# Patient Record
Sex: Male | Born: 1980 | Race: White | Hispanic: No | Marital: Single | Smoking: Former smoker
Health system: Southern US, Community
[De-identification: ages and names within clinical notes are randomized; demographics above are authoritative.]

## PROBLEM LIST (undated history)

## (undated) DIAGNOSIS — R002 Palpitations: Secondary | ICD-10-CM

## (undated) DIAGNOSIS — R000 Tachycardia, unspecified: Secondary | ICD-10-CM

---

## 2016-05-24 ENCOUNTER — Emergency Department (HOSPITAL_BASED_OUTPATIENT_CLINIC_OR_DEPARTMENT_OTHER): Payer: BLUE CROSS/BLUE SHIELD

## 2016-05-24 ENCOUNTER — Encounter (HOSPITAL_BASED_OUTPATIENT_CLINIC_OR_DEPARTMENT_OTHER): Payer: Self-pay | Admitting: Emergency Medicine

## 2016-05-24 ENCOUNTER — Emergency Department (HOSPITAL_BASED_OUTPATIENT_CLINIC_OR_DEPARTMENT_OTHER)
Admission: EM | Admit: 2016-05-24 | Discharge: 2016-05-24 | Disposition: A | Discharge status: Home / Self Care | Payer: BLUE CROSS/BLUE SHIELD | Attending: Emergency Medicine | Admitting: Emergency Medicine

## 2016-05-24 DIAGNOSIS — R079 Chest pain, unspecified: Secondary | ICD-10-CM | POA: Diagnosis present

## 2016-05-24 DIAGNOSIS — Z87891 Personal history of nicotine dependence: Secondary | ICD-10-CM | POA: Insufficient documentation

## 2016-05-24 DIAGNOSIS — R0789 Other chest pain: Secondary | ICD-10-CM | POA: Diagnosis not present

## 2016-05-24 HISTORY — DX: Palpitations: R00.2

## 2016-05-24 HISTORY — DX: Tachycardia, unspecified: R00.0

## 2016-05-24 MED ORDER — IBUPROFEN 800 MG PO TABS
800.0000 mg | ORAL_TABLET | Freq: Once | ORAL | Status: AC
  Administered 2016-05-24: 800 mg via ORAL
  Filled 2016-05-24: qty 1

## 2016-05-24 NOTE — Discharge Instructions (Addendum)
Fill the prescription for the anti-inflammatory given to you at urgent care. Take the medicine as prescribed. Routine primary care or cardiologist follow-up.

## 2016-05-24 NOTE — ED Notes (Signed)
Pt on cardiac monitor, continuous pulse ox and automatic VS. 

## 2016-05-24 NOTE — ED Triage Notes (Signed)
Patient states that he is having right sided chest pain for about 4 -5 days. The patient reports that he has already been "tested" for this and nothing was found. He is supposed to follow up with his cardiologist. He reports blurred vision, and palpitations with this

## 2016-05-24 NOTE — ED Provider Notes (Addendum)
[facility] Provider [facility]   CSN: [id number] Arrival date & time: 05/24/16  1246     History   Chief Complaint Chief Complaint  Patient presents with  . Chest Pain    HPI Scott Anthony is a 35 y.o. male.  HPI:  Patient presents for evaluation of right-sided chest pain. Apparently he was at an urgent care center earlier today. He states a reassured him that "everything looked okay". He was given a prescription for anti-inflammatory that "starts with a D". He states that he stopped and filled the medication at pharmacy. However, he did not take any other than he proceeded here for evaluation.  He describes point right-sided chest pain intermittent for last 4 days. States occasionally it is sharp and radiates around to his back. It is not left-sided. It does not get lightheaded dizzy weak or short of breath. Triage note said he states he gets blurred vision. He states that this is "almost all the time I guess I need glasses". He sees a cardiologist for "palpitations". States he's had a stress test and Holter monitor "they told me that my heart rate just gets high and they told me I could take a low strength beta blocker.  Is not smoke. He has no history of hypertension diabetes. States his dad had high cholesterol but he "thinks" that his is okay. No cocaine or amphetamine use. No risks for DVT. Family history negative. Heart score O, with pending EKG and chest x-ray.  Past Medical History:  Diagnosis Date  . Palpitations   . Tachycardia     There are no active problems to display for this patient.   History reviewed. No pertinent surgical history.     Home Medications    Prior to Admission medications   Not on File    Family History History reviewed. No pertinent family history.  Social History Social History  Substance Use Topics  . Smoking status: Former Games developermoker  . Smokeless tobacco: Never Used  . Alcohol use Yes     Comment: socially      Allergies   Ivp dye [iodinated diagnostic agents] and Keflex [cephalexin]   Review of Systems Review of Systems  Constitutional: Negative for appetite change, chills, diaphoresis, fatigue and fever.  HENT: Negative for mouth sores, sore throat and trouble swallowing.   Eyes: Negative for visual disturbance.  Respiratory: Negative for cough, chest tightness, shortness of breath and wheezing.   Cardiovascular: Positive for chest pain.  Gastrointestinal: Negative for abdominal distention, abdominal pain, diarrhea, nausea and vomiting.  Endocrine: Negative for polydipsia, polyphagia and polyuria.  Genitourinary: Negative for dysuria, frequency and hematuria.  Musculoskeletal: Negative for gait problem.  Skin: Negative for color change, pallor and rash.  Neurological: Negative for dizziness, syncope, light-headedness and headaches.  Hematological: Does not bruise/bleed easily.  Psychiatric/Behavioral: Negative for behavioral problems and confusion.     Physical Exam Updated Vital Signs BP 126/88 (BP Location: Right Arm)   Pulse 74   Temp 97.9 F (36.6 C) (Oral)   Resp 16   Ht 5\' 10"  (1.778 m)   Wt 169 lb 6.4 oz (76.8 kg)   SpO2 99%   BMI 24.31 kg/m   Physical Exam  Constitutional: He is oriented to person, place, and time. He appears well-developed and well-nourished. No distress.  HENT:  Head: Normocephalic.  Eyes: Conjunctivae are normal. Pupils are equal, round, and reactive to light. No scleral icterus.  Neck: Normal range of motion. Neck supple. No thyromegaly present.  Cardiovascular: Normal rate and regular rhythm.  Exam reveals no gallop and no friction rub.   No murmur heard. Pulmonary/Chest: Effort normal and breath sounds normal. No respiratory distress. He has no wheezes. He has no rales.    Abdominal: Soft. Bowel sounds are normal. He exhibits no distension. There is no tenderness. There is no rebound.  Musculoskeletal: Normal range of motion.   Neurological: He is alert and oriented to person, place, and time.  Skin: Skin is warm and dry. No rash noted.  Psychiatric: He has a normal mood and affect. His behavior is normal.     ED Treatments / Results  Labs (all labs ordered are listed, but only abnormal results are displayed) Labs Reviewed - No data to display  EKG  EKG Interpretation None       Radiology No results found.  Procedures Procedures (including critical care time)  Medications Ordered in ED Medications  ibuprofen (ADVIL,MOTRIN) tablet 800 mg (not administered)     Initial Impression / Assessment and Plan / ED Course  I have reviewed the triage vital signs and the nursing notes.  Pertinent labs & imaging results that were available during my care of the patient were reviewed by me and considered in my medical decision making (see chart for details).  Clinical Course     Low-risk patient. Not tachycardic or hypoxemic. No risks for DVT PE. Park negative. Heart score 0 with normal EKG. Troponin not performed. Chest x-ray requested. If negative patient can be treated with anti-inflammatories and routine follow-up  Final Clinical Impressions(s) / ED Diagnoses   Final diagnoses:  Chest pain, unspecified type    New Prescriptions New Prescriptions   No medications on file     Rolland PorterMark Torien Ramroop, MD 05/24/16 1321    Rolland PorterMark Ari Bernabei, MD 05/24/16 1455

## 2016-05-24 NOTE — ED Notes (Signed)
Pt given d/c instructions as per chart. Verbalizes understanding. No questions. 

## 2016-11-10 ENCOUNTER — Emergency Department (HOSPITAL_BASED_OUTPATIENT_CLINIC_OR_DEPARTMENT_OTHER)
Admission: EM | Admit: 2016-11-10 | Discharge: 2016-11-11 | Disposition: A | Discharge status: Home / Self Care | Payer: BLUE CROSS/BLUE SHIELD | Attending: Emergency Medicine | Admitting: Emergency Medicine

## 2016-11-10 ENCOUNTER — Encounter (HOSPITAL_BASED_OUTPATIENT_CLINIC_OR_DEPARTMENT_OTHER): Payer: Self-pay | Admitting: Emergency Medicine

## 2016-11-10 ENCOUNTER — Emergency Department (HOSPITAL_BASED_OUTPATIENT_CLINIC_OR_DEPARTMENT_OTHER): Payer: BLUE CROSS/BLUE SHIELD

## 2016-11-10 DIAGNOSIS — R51 Headache: Secondary | ICD-10-CM | POA: Diagnosis not present

## 2016-11-10 DIAGNOSIS — R519 Headache, unspecified: Secondary | ICD-10-CM

## 2016-11-10 DIAGNOSIS — Z87891 Personal history of nicotine dependence: Secondary | ICD-10-CM | POA: Insufficient documentation

## 2016-11-10 NOTE — ED Provider Notes (Addendum)
[facility] Provider Note: Lowella Dell, MD, FACEP  CSN: [id number] MRN: [id number] ARRIVAL: 11/10/16 at 2236 ROOM: [facility]11/[facility]   CHIEF COMPLAINT  Headache   HISTORY OF PRESENT ILLNESS  Scott Anthony is a 36 y.o. male with about a two-week history of headaches. The headaches are intermittent. He sometimes has a mild ache. He sometimes has sudden onset of fairly severe pain. The headaches last varying amounts of time. They vary in the location sometimes frontally, sometimes facially, sometimes the top of the head, sometimes occipitally. There is no specific trigger. He has taken Tylenol and Allegra without relief. He was seen in urgent care about a week and a half ago and was scheduled for a CT scan which he canceled because his headaches improved. His headaches returned today and have varied in intensity and location. He has had some occasional nausea but no vomiting. He has had some occasional blurred vision and photophobia but none presently.    Past Medical History:  Diagnosis Date  . Palpitations   . Tachycardia     History reviewed. No pertinent surgical history.  History reviewed. No pertinent family history.  Social History  Substance Use Topics  . Smoking status: Former Games developer  . Smokeless tobacco: Never Used  . Alcohol use Yes     Comment: socially    Prior to Admission medications   Not on File    Allergies Ivp dye [iodinated diagnostic agents] and Keflex [cephalexin]   REVIEW OF SYSTEMS  Negative except as noted here or in the History of Present Illness.   PHYSICAL EXAMINATION  Initial Vital Signs Blood pressure 119/79, pulse 78, temperature 97.9 F (36.6 C), temperature source Oral, resp. rate 18, height 5\' 10"  (1.778 m), weight 74.8 kg (165 lb), SpO2 100 %.  Examination General: Well-developed, well-nourished male in no acute distress; appearance consistent with age of record HENT: normocephalic; atraumatic Eyes: pupils equal, round and  reactive to light; extraocular muscles intact Neck: supple Heart: regular rate and rhythm Lungs: clear to auscultation bilaterally Abdomen: soft; nondistended; nontender; bowel sounds present Extremities: No deformity; full range of motion; pulses normal Neurologic: Awake, alert and oriented; motor function intact in all extremities and symmetric; no facial droop; normal coordination, speech and gait; negative Romberg; normal finger to nose Skin: Warm and dry Psychiatric: Normal mood and affect   RESULTS  Summary of this visit's results, reviewed by myself:   EKG Interpretation  Date/Time:    Ventricular Rate:    PR Interval:    QRS Duration:   QT Interval:    QTC Calculation:   R Axis:     Text Interpretation:        Laboratory Studies: No results found for this or any previous visit (from the past 24 hour(s)). Imaging Studies: Ct Head Wo Contrast  Result Date: 11/11/2016 CLINICAL DATA:  Subacute onset of bifrontal and occipital headache. Initial encounter. EXAM: CT HEAD WITHOUT CONTRAST TECHNIQUE: Contiguous axial images were obtained from the base of the skull through the vertex without intravenous contrast. COMPARISON:  CT of the head performed 11/01/2015 FINDINGS: Brain: No evidence of acute infarction, hemorrhage, hydrocephalus, extra-axial collection or mass lesion/mass effect. The posterior fossa, including the cerebellum, brainstem and fourth ventricle, is within normal limits. The third and lateral ventricles, and basal ganglia are unremarkable in appearance. The cerebral hemispheres are symmetric in appearance, with normal gray-white differentiation. No mass effect or midline shift is seen. Vascular: No hyperdense vessel or unexpected calcification. Skull: There is no  evidence of fracture; visualized osseous structures are unremarkable in appearance. Sinuses/Orbits: The orbits are within normal limits. The paranasal sinuses and mastoid air cells are well-aerated. Other: No  significant soft tissue abnormalities are seen. IMPRESSION: Unremarkable noncontrast CT of the head. Electronically Signed   By: Roanna RaiderJeffery  Chang M.D.   On: 11/11/2016 00:09    ED COURSE  Nursing notes and initial vitals signs, including pulse oximetry, reviewed.  Vitals:   11/10/16 2245 11/11/16 0117  BP: 119/79 112/75  Pulse: 78 77  Resp: 18 18  Temp: 97.9 F (36.6 C)   TempSrc: Oral   SpO2: 100% 96%  Weight: 74.8 kg (165 lb)   Height: 5\' 10"  (1.778 m)    12:18 AM Patient states his headache is gotten significantly worse after getting up and going to the bathroom. He was advised of his reassuring head CT. We will given IV migraine cocktail and reevaluate.  1:22 AM Pain significantly improved after IV fluids and medications. The patient states he has a neurologist with whom he can follow-up. He was seen last year for headaches and diagnosed with tension headaches.  PROCEDURES    ED DIAGNOSES     ICD-10-CM   1. Headache disorder R51        Scott Draheim, MD 11/11/16 0123    Paula LibraMolpus, Crystalee Ventress, MD 11/11/16 [id number]

## 2016-11-10 NOTE — ED Triage Notes (Signed)
Patient reports a headache x 2 weeks. Reports that he went to an urgent care and they wanted him to have a Head CT - since he was better he cancelled the head CT - patient states that the pain is worse today. Denies any N/V

## 2016-11-11 MED ORDER — DIPHENHYDRAMINE HCL 50 MG/ML IJ SOLN
25.0000 mg | Freq: Once | INTRAMUSCULAR | Status: AC
  Administered 2016-11-11: 25 mg via INTRAVENOUS
  Filled 2016-11-11: qty 1

## 2016-11-11 MED ORDER — SODIUM CHLORIDE 0.9 % IV BOLUS (SEPSIS)
1000.0000 mL | Freq: Once | INTRAVENOUS | Status: AC
  Administered 2016-11-11: 1000 mL via INTRAVENOUS

## 2016-11-11 MED ORDER — METOCLOPRAMIDE HCL 5 MG/ML IJ SOLN
10.0000 mg | Freq: Once | INTRAMUSCULAR | Status: AC
  Administered 2016-11-11: 10 mg via INTRAVENOUS
  Filled 2016-11-11: qty 2

## 2016-11-11 MED ORDER — KETOROLAC TROMETHAMINE 15 MG/ML IJ SOLN
15.0000 mg | Freq: Once | INTRAMUSCULAR | Status: AC
Start: 2016-11-11 — End: 2016-11-11
  Administered 2016-11-11: 15 mg via INTRAVENOUS
  Filled 2016-11-11: qty 1

## 2016-11-11 NOTE — ED Notes (Signed)
Mild headache 2 Thursdays ago and went away.  He is [facility] but his headache went away and he cancelled this appointment.    This morning, his headache came back and he took Tylenol and Allegra with some relief.    "This evening, the headache came back again and it is very bad right now."

## 2016-11-11 NOTE — ED Notes (Signed)
ED Provider at bedside. 

## 2017-06-02 ENCOUNTER — Other Ambulatory Visit: Payer: Self-pay

## 2017-06-02 ENCOUNTER — Encounter (HOSPITAL_BASED_OUTPATIENT_CLINIC_OR_DEPARTMENT_OTHER): Payer: Self-pay

## 2017-06-02 ENCOUNTER — Emergency Department (HOSPITAL_BASED_OUTPATIENT_CLINIC_OR_DEPARTMENT_OTHER)
Admission: EM | Admit: 2017-06-02 | Discharge: 2017-06-03 | Disposition: A | Discharge status: Home / Self Care | Payer: BLUE CROSS/BLUE SHIELD | Attending: Emergency Medicine | Admitting: Emergency Medicine

## 2017-06-02 ENCOUNTER — Emergency Department (HOSPITAL_BASED_OUTPATIENT_CLINIC_OR_DEPARTMENT_OTHER): Payer: BLUE CROSS/BLUE SHIELD

## 2017-06-02 DIAGNOSIS — R1031 Right lower quadrant pain: Secondary | ICD-10-CM | POA: Insufficient documentation

## 2017-06-02 DIAGNOSIS — Z87891 Personal history of nicotine dependence: Secondary | ICD-10-CM | POA: Diagnosis not present

## 2017-06-02 LAB — URINALYSIS, ROUTINE W REFLEX MICROSCOPIC
BILIRUBIN URINE: NEGATIVE
Glucose, UA: NEGATIVE mg/dL
Hgb urine dipstick: NEGATIVE
KETONES UR: NEGATIVE mg/dL
LEUKOCYTES UA: NEGATIVE
NITRITE: NEGATIVE
PROTEIN: NEGATIVE mg/dL
Specific Gravity, Urine: <1.005 — ABNORMAL LOW (ref 1.005–1.030)
pH: 7.5 (ref 5.0–8.0)

## 2017-06-02 LAB — CBC
HCT: 40.6 % (ref 39.0–52.0)
Hemoglobin: 13.8 g/dL (ref 13.0–17.0)
MCH: 30.3 pg (ref 26.0–34.0)
MCHC: 34 g/dL (ref 30.0–36.0)
MCV: 89 fL (ref 78.0–100.0)
Platelets: 224 10*3/uL (ref 150–400)
RBC: 4.56 MIL/uL (ref 4.22–5.81)
RDW: 11.9 % (ref 11.5–15.5)
WBC: 10.9 10*3/uL — AB (ref 4.0–10.5)

## 2017-06-02 LAB — COMPREHENSIVE METABOLIC PANEL
ALBUMIN: 4.6 g/dL (ref 3.5–5.0)
ALK PHOS: 76 U/L (ref 38–126)
ALT: 18 U/L (ref 17–63)
ANION GAP: 9 (ref 5–15)
AST: 18 U/L (ref 15–41)
BILIRUBIN TOTAL: 0.6 mg/dL (ref 0.3–1.2)
BUN: 12 mg/dL (ref 6–20)
CO2: 28 mmol/L (ref 22–32)
Calcium: 9.5 mg/dL (ref 8.9–10.3)
Chloride: 101 mmol/L (ref 101–111)
Creatinine, Ser: 0.84 mg/dL (ref 0.61–1.24)
GFR calc Af Amer: >60 mL/min (ref >60)
GLUCOSE: 114 mg/dL — AB (ref 65–99)
POTASSIUM: 3.8 mmol/L (ref 3.5–5.1)
Sodium: 138 mmol/L (ref 135–145)
TOTAL PROTEIN: 7.3 g/dL (ref 6.5–8.1)

## 2017-06-02 LAB — LIPASE, BLOOD: Lipase: 48 U/L (ref 11–51)

## 2017-06-02 MED ORDER — SODIUM CHLORIDE 0.9 % IV BOLUS (SEPSIS)
1000.0000 mL | Freq: Once | INTRAVENOUS | Status: AC
  Administered 2017-06-02: 1000 mL via INTRAVENOUS

## 2017-06-02 NOTE — ED Triage Notes (Signed)
C/o RLQ pain x 48 hours-states pain started after possible partial chicken bone ingestion-NAD-steady gait

## 2017-06-02 NOTE — ED Provider Notes (Signed)
[facility] EMERGENCY DEPARTMENT Provider Note   CSN: [id number] Arrival date & time: 06/02/17  1948     History   Chief Complaint Chief Complaint  Patient presents with  . Abdominal Pain    HPI Scott Anthony is a 37 y.o. male.  HPI  37 year old male presents with right lower quadrant abdominal pain.  This started 2 nights ago.  He states that he was eating chicken and noticed a small piece of bone when he was chewing.  He pulled this out but then felt a scratching sensation and is wondering if he swallowed another piece of bone.  About 4 hours later that night he developed some right lower quadrant pain that was mild.  This has gradually worsened and at rest is about a 4-5 out of 10.  After palpation by the nurse he states the pain went up to a 7 or 8 out of 10.  He denies any fevers, nausea, vomiting.  No diarrhea or constipation.  No blood in his stool.  No dysuria or hematuria.  Past Medical History:  Diagnosis Date  . Palpitations   . Tachycardia     There are no active problems to display for this patient.   History reviewed. No pertinent surgical history.     Home Medications    Prior to Admission medications   Not on File    Family History No family history on file.  Social History Social History   Tobacco Use  . Smoking status: Former Games developermoker  . Smokeless tobacco: Never Used  Substance Use Topics  . Alcohol use: Yes    Comment: occ  . Drug use: No     Allergies   Ivp dye [iodinated diagnostic agents] and Keflex [cephalexin]   Review of Systems Review of Systems  Constitutional: Negative for fever.  Gastrointestinal: Positive for abdominal pain. Negative for blood in stool, constipation, diarrhea, nausea and vomiting.  Genitourinary: Negative for dysuria, hematuria, penile pain and testicular pain.  Musculoskeletal: Negative for back pain.  All other systems reviewed and are negative.    Physical Exam Updated Vital Signs BP  125/82   Pulse 67   Temp 99.3 F (37.4 C) (Oral)   Resp 18   Ht 5\' 10"  (1.778 m)   Wt 71.2 kg (157 lb)   SpO2 100%   BMI 22.53 kg/m   Physical Exam  Constitutional: He is oriented to person, place, and time. He appears well-developed and well-nourished.  Non-toxic appearance. He does not appear ill. No distress.  HENT:  Head: Normocephalic and atraumatic.  Right Ear: External ear normal.  Left Ear: External ear normal.  Nose: Nose normal.  Eyes: Right eye exhibits no discharge. Left eye exhibits no discharge.  Neck: Neck supple.  Cardiovascular: Normal rate, regular rhythm and normal heart sounds.  Pulmonary/Chest: Effort normal and breath sounds normal.  Abdominal: Soft. There is tenderness in the right lower quadrant. There is no CVA tenderness.  Musculoskeletal: He exhibits no edema.  Neurological: He is alert and oriented to person, place, and time.  Skin: Skin is warm and dry.  Nursing note and vitals reviewed.    ED Treatments / Results  Labs (all labs ordered are listed, but only abnormal results are displayed) Labs Reviewed  COMPREHENSIVE METABOLIC PANEL - Abnormal; Notable for the following components:      Result Value   Glucose, Bld 114 (*)    All other components within normal limits  CBC - Abnormal; Notable for the following  components:   WBC 10.9 (*)    All other components within normal limits  URINALYSIS, ROUTINE W REFLEX MICROSCOPIC - Abnormal; Notable for the following components:   Specific Gravity, Urine <1.005 (*)    All other components within normal limits  LIPASE, BLOOD    EKG  EKG Interpretation None       Radiology No results found.  Procedures Procedures (including critical care time)  Medications Ordered in ED Medications  sodium chloride 0.9 % bolus 1,000 mL (0 mLs Intravenous Stopped 06/03/17 0004)     Initial Impression / Assessment and Plan / ED Course  I have reviewed the triage vital signs and the nursing  notes.  Pertinent labs & imaging results that were available during my care of the patient were reviewed by me and considered in my medical decision making (see chart for details).     The patient does not feel like he actually swallowed a chicken bone and my suspicion is this is not related.  Given the progressive right lower quadrant pain with mild WBC elevation, he will need a CT scan to rule out or rule in appendicitis.  He declines pain meds at this time.  Care transferred to Dr. Judd Lien with CT pending.  Final Clinical Impressions(s) / ED Diagnoses   Final diagnoses:  None    ED Discharge Orders    None       Pricilla Loveless, MD 06/03/17 0006

## 2017-06-03 NOTE — Discharge Instructions (Signed)
Ibuprofen 600 mg every 6 hours as needed for pain.  All up with your primary doctor if not improving in the next 2-3 days, and return to the ER if your symptoms significantly worsen or change.

## 2017-06-03 NOTE — ED Notes (Signed)
Patient transported to CT 

## 2017-07-09 ENCOUNTER — Encounter (HOSPITAL_BASED_OUTPATIENT_CLINIC_OR_DEPARTMENT_OTHER): Payer: Self-pay | Admitting: *Deleted

## 2017-07-09 ENCOUNTER — Emergency Department (HOSPITAL_BASED_OUTPATIENT_CLINIC_OR_DEPARTMENT_OTHER): Payer: BLUE CROSS/BLUE SHIELD

## 2017-07-09 ENCOUNTER — Emergency Department (HOSPITAL_BASED_OUTPATIENT_CLINIC_OR_DEPARTMENT_OTHER)
Admission: EM | Admit: 2017-07-09 | Discharge: 2017-07-10 | Disposition: A | Discharge status: Home / Self Care | Payer: BLUE CROSS/BLUE SHIELD | Attending: Emergency Medicine | Admitting: Emergency Medicine

## 2017-07-09 DIAGNOSIS — N50811 Right testicular pain: Secondary | ICD-10-CM | POA: Insufficient documentation

## 2017-07-09 DIAGNOSIS — R69 Illness, unspecified: Secondary | ICD-10-CM

## 2017-07-09 DIAGNOSIS — R509 Fever, unspecified: Secondary | ICD-10-CM | POA: Diagnosis present

## 2017-07-09 DIAGNOSIS — J111 Influenza due to unidentified influenza virus with other respiratory manifestations: Secondary | ICD-10-CM

## 2017-07-09 DIAGNOSIS — J1189 Influenza due to unidentified influenza virus with other manifestations: Secondary | ICD-10-CM | POA: Insufficient documentation

## 2017-07-09 DIAGNOSIS — Z87891 Personal history of nicotine dependence: Secondary | ICD-10-CM | POA: Diagnosis not present

## 2017-07-09 MED ORDER — IBUPROFEN 800 MG PO TABS: 800.0000 mg | ORAL_TABLET | Freq: Three times a day (TID) | ORAL | 0 refills | Status: AC

## 2017-07-09 MED ORDER — PROMETHAZINE-DM 6.25-15 MG/5ML PO SYRP: 5.0000 mL | ORAL_SOLUTION | Freq: Four times a day (QID) | ORAL | 0 refills | Status: AC | PRN

## 2017-07-09 MED ORDER — IBUPROFEN 800 MG PO TABS
800.0000 mg | ORAL_TABLET | Freq: Once | ORAL | Status: AC
  Administered 2017-07-09: 800 mg via ORAL
  Filled 2017-07-09: qty 1

## 2017-07-09 MED ORDER — BENZONATATE 100 MG PO CAPS: 100.0000 mg | ORAL_CAPSULE | Freq: Three times a day (TID) | ORAL | 0 refills | Status: AC

## 2017-07-09 NOTE — Discharge Instructions (Signed)
Your symptoms are consistent with influenza. Rest, push fluids, use Ibuprofen for body aches, Tylenol for fever. Tessalon for daytime cough.  Phenergan DM for nighttime cough

## 2017-07-09 NOTE — ED Triage Notes (Signed)
Pt reports right testicular pain x 1-2 weeks-has been treated by his urologist for epididymitis.  States that he has a testicular US scheduled for 2.25.19. Also reports fever.

## 2017-07-09 NOTE — ED Provider Notes (Signed)
[facility] EMERGENCY DEPARTMENT Provider Note   CSN: [id number] Arrival date & time: 07/09/17  1754     History   Chief Complaint Chief Complaint  Patient presents with  . Fever    HPI Scott Anthony is a 37 y.o. male.  Chief complaint is continued testicular pain, fever body aches cough.  HPI 37 year old male.  Was not immunized for flu.  He coaches a high school swim team.  He has had a cough fever and body aches for 4 days.  Nonproductive dry cough.  Headache.  Sore throat.  No nausea vomiting or diarrhea.  Recently treated for epididymitis by urology.  Has 2 days left of doxycycline.  Is improving but still has some mild continued testicular pain.  He became concerned when he started running fever as he was warned by his urologist to be reevaluated if he did develop a fever.  Past Medical History:  Diagnosis Date  . Palpitations   . Tachycardia     There are no active problems to display for this patient.   History reviewed. No pertinent surgical history.     Home Medications    Prior to Admission medications   Medication Sig Start Date End Date Taking? Authorizing Provider  benzonatate (TESSALON) 100 MG capsule Take 1 capsule (100 mg total) by mouth every 8 (eight) hours. 07/09/17   Rolland Porter, MD  ibuprofen (ADVIL,MOTRIN) 800 MG tablet Take 1 tablet (800 mg total) by mouth 3 (three) times daily. 07/09/17   Rolland Porter, MD  promethazine-dextromethorphan (PROMETHAZINE-DM) 6.25-15 MG/5ML syrup Take 5 mLs by mouth 4 (four) times daily as needed for cough. 07/09/17   Rolland Porter, MD    Family History History reviewed. No pertinent family history.  Social History Social History   Tobacco Use  . Smoking status: Former Games developer  . Smokeless tobacco: Never Used  Substance Use Topics  . Alcohol use: Yes    Comment: occ  . Drug use: No     Allergies   Ivp dye [iodinated diagnostic agents] and Keflex [cephalexin]   Review of Systems Review of Systems    Constitutional: Positive for chills, fatigue and fever. Negative for appetite change and diaphoresis.  HENT: Positive for sore throat. Negative for mouth sores and trouble swallowing.   Eyes: Negative for visual disturbance.  Respiratory: Positive for cough. Negative for chest tightness, shortness of breath and wheezing.   Cardiovascular: Negative for chest pain.  Gastrointestinal: Negative for abdominal distention, abdominal pain, diarrhea, nausea and vomiting.  Endocrine: Negative for polydipsia, polyphagia and polyuria.  Genitourinary: Positive for testicular pain. Negative for dysuria, frequency and hematuria.  Musculoskeletal: Negative for gait problem.  Skin: Negative for color change, pallor and rash.  Neurological: Positive for headaches. Negative for dizziness, syncope and light-headedness.  Hematological: Does not bruise/bleed easily.  Psychiatric/Behavioral: Negative for behavioral problems and confusion.     Physical Exam Updated Vital Signs BP 116/81 (BP Location: Left Arm)   Pulse 89   Temp (!) 101.5 F (38.6 C) (Bladder)   Resp 20   Ht 5\' 10"  (1.778 m)   Wt 70.3 kg (155 lb)   SpO2 100%   BMI 22.24 kg/m   Physical Exam  Constitutional: He is oriented to person, place, and time. He appears well-developed and well-nourished. No distress.  Appears to not feel well.  Not toxic.  HENT:  Head: Normocephalic.  Next erythematous.  No exudate.  No adenopathy.  Eyes: Conjunctivae are normal. Pupils are equal, round, and reactive  to light. No scleral icterus.  Neck: Normal range of motion. Neck supple. No thyromegaly present.  Cardiovascular: Normal rate and regular rhythm. Exam reveals no gallop and no friction rub.  No murmur heard. Pulmonary/Chest: Effort normal and breath sounds normal. No respiratory distress. He has no wheezes. He has no rales.  Clear bilateral breath sounds.  No wheezing rales or rhonchi.  Abdominal: Soft. Bowel sounds are normal. He exhibits no  distension. There is no tenderness. There is no rebound.  Genitourinary:  Genitourinary Comments: Normal testicular exam.  No epididymal enlargement.  Normal scrotal skin.  No hernia.  Musculoskeletal: Normal range of motion.  Neurological: He is alert and oriented to person, place, and time.  Skin: Skin is warm and dry. No rash noted.  Psychiatric: He has a normal mood and affect. His behavior is normal.     ED Treatments / Results  Labs (all labs ordered are listed, but only abnormal results are displayed) Labs Reviewed - No data to display  EKG  EKG Interpretation None       Radiology [place] Scrotum W/doppler  Result Date: 07/09/2017 CLINICAL DATA:  Right-sided testicular swelling and pain. EXAM: SCROTAL ULTRASOUND DOPPLER ULTRASOUND OF THE TESTICLES TECHNIQUE: Complete ultrasound examination of the testicles, epididymis, and other scrotal structures was performed. Color and spectral Doppler ultrasound were also utilized to evaluate blood flow to the testicles. COMPARISON:  Scrotal ultrasound dated December 19, 2014. FINDINGS: Right testicle Measurements: 5.0 X 2.6 x 3.5 cm. No mass or microlithiasis visualized. Left testicle Measurements: 4.8 x 2.5 x 3.6 cm. No mass or microlithiasis visualized. Right epididymis: Normal in size and appearance. Small 4 mm cyst in the epididymal head. Left epididymis:  Normal in size and appearance. Hydrocele:  Small bilateral hydroceles. Varicocele:  None visualized. Pulsed Doppler interrogation of both testes demonstrates normal low resistance arterial and venous waveforms bilaterally. IMPRESSION: 1. No acute abnormality. 2. Small bilateral hydroceles. Electronically Signed   By: Obie DredgeWilliam T Derry M.D.   On: 07/09/2017 19:50    Procedures Procedures (including critical care time)  Medications Ordered in ED Medications  ibuprofen (ADVIL,MOTRIN) tablet 800 mg (800 mg Oral Given 07/09/17 2213)     Initial Impression / Assessment and Plan / ED Course  I  have reviewed the triage vital signs and the nursing notes.  Pertinent labs & imaging results that were available during my care of the patient were reviewed by me and considered in my medical decision making (see chart for details).    Ultrasound ordered by my partner through triage.  Shows no gross abnormalities and no epididymitis.  Plan is home.  He has an influenza-like illness.  He is out of the window for treatment.  Plan ibuprofen, Tylenol, Tessalon, Phenergan DM as needed.  Final Clinical Impressions(s) / ED Diagnoses   Final diagnoses:  Influenza-like illness    ED Discharge Orders        Ordered    ibuprofen (ADVIL,MOTRIN) 800 MG tablet  3 times daily     07/09/17 2333    promethazine-dextromethorphan (PROMETHAZINE-DM) 6.25-15 MG/5ML syrup  4 times daily PRN     07/09/17 2333    benzonatate (TESSALON) 100 MG capsule  Every 8 hours     07/09/17 2333       Rolland PorterJames, Annalia Metzger, MD 07/09/17 2336

## 2017-12-19 ENCOUNTER — Other Ambulatory Visit: Payer: Self-pay

## 2017-12-19 ENCOUNTER — Encounter (HOSPITAL_BASED_OUTPATIENT_CLINIC_OR_DEPARTMENT_OTHER): Payer: Self-pay | Admitting: Emergency Medicine

## 2017-12-19 ENCOUNTER — Emergency Department (HOSPITAL_BASED_OUTPATIENT_CLINIC_OR_DEPARTMENT_OTHER): Payer: BLUE CROSS/BLUE SHIELD

## 2017-12-19 ENCOUNTER — Emergency Department (HOSPITAL_BASED_OUTPATIENT_CLINIC_OR_DEPARTMENT_OTHER)
Admission: EM | Admit: 2017-12-19 | Discharge: 2017-12-19 | Disposition: A | Discharge status: Home / Self Care | Payer: BLUE CROSS/BLUE SHIELD | Attending: Emergency Medicine | Admitting: Emergency Medicine

## 2017-12-19 DIAGNOSIS — N451 Epididymitis: Secondary | ICD-10-CM | POA: Diagnosis not present

## 2017-12-19 DIAGNOSIS — N50812 Left testicular pain: Secondary | ICD-10-CM

## 2017-12-19 DIAGNOSIS — Z87891 Personal history of nicotine dependence: Secondary | ICD-10-CM | POA: Insufficient documentation

## 2017-12-19 LAB — URINALYSIS, ROUTINE W REFLEX MICROSCOPIC
Bilirubin Urine: NEGATIVE
Glucose, UA: NEGATIVE mg/dL
Hgb urine dipstick: NEGATIVE
Ketones, ur: NEGATIVE mg/dL
Leukocytes, UA: NEGATIVE
Nitrite: NEGATIVE
Protein, ur: NEGATIVE mg/dL
Specific Gravity, Urine: 1.01 (ref 1.005–1.030)
pH: 6 (ref 5.0–8.0)

## 2017-12-19 MED ORDER — TRAMADOL HCL 50 MG PO TABS: 50.0000 mg | ORAL_TABLET | Freq: Four times a day (QID) | ORAL | 0 refills | Status: AC | PRN

## 2017-12-19 NOTE — ED Provider Notes (Signed)
[facility] EMERGENCY DEPARTMENT Provider Note   CSN: [id number] Arrival date & time: 12/19/17  1457     History   Chief Complaint Chief Complaint  Patient presents with  . Testicle Pain    HPI Scott Anthony is a 37 y.o. male.  HPI  37 year old male with left testicular pain.  Pain is actually began on for several weeks.  Initially had pain in his left lower back with radiation to his left testicle.  His back pain is actually improved but he still is having persistent pain in the testicle itself.  He has been evaluated by urology for the same.  Currently being treated for possible epididymitis.  States that symptoms have not improved.  No swelling.  No discharge.  No fevers or chills.  Testicular pain is worse with movement in general.  States that he has not had sexual contact in several months.  Past Medical History:  Diagnosis Date  . Palpitations   . Tachycardia     There are no active problems to display for this patient.   History reviewed. No pertinent surgical history.      Home Medications    Prior to Admission medications   Medication Sig Start Date End Date Taking? Authorizing Provider  benzonatate (TESSALON) 100 MG capsule Take 1 capsule (100 mg total) by mouth every 8 (eight) hours. 07/09/17   Rolland Porter, MD  ibuprofen (ADVIL,MOTRIN) 800 MG tablet Take 1 tablet (800 mg total) by mouth 3 (three) times daily. 07/09/17   Rolland Porter, MD  promethazine-dextromethorphan (PROMETHAZINE-DM) 6.25-15 MG/5ML syrup Take 5 mLs by mouth 4 (four) times daily as needed for cough. 07/09/17   Rolland Porter, MD    Family History No family history on file.  Social History Social History   Tobacco Use  . Smoking status: Former Games developer  . Smokeless tobacco: Never Used  Substance Use Topics  . Alcohol use: Yes    Comment: occ  . Drug use: No     Allergies   Ivp dye [iodinated diagnostic agents] and Keflex [cephalexin]   Review of Systems Review of  Systems  All systems reviewed and negative, other than as noted in HPI.  Physical Exam Updated Vital Signs BP 105/81 (BP Location: Left Arm)   Pulse 94   Temp 98.5 F (36.9 C) (Oral)   Resp 18   Ht 5\' 10"  (1.778 m)   Wt 72.6 kg (160 lb)   SpO2 98%   BMI 22.96 kg/m   Physical Exam  Constitutional: He appears well-developed and well-nourished. No distress.  HENT:  Head: Normocephalic and atraumatic.  Eyes: Conjunctivae are normal. Right eye exhibits no discharge. Left eye exhibits no discharge.  Neck: Neck supple.  Cardiovascular: Normal rate, regular rhythm and normal heart sounds. Exam reveals no gallop and no friction rub.  No murmur heard. Pulmonary/Chest: Effort normal and breath sounds normal. No respiratory distress.  Abdominal: Soft. He exhibits no distension. There is no tenderness.  Genitourinary:  Genitourinary Comments: Tenderness to palpation of the left testicle.  No masses felt.  No scrotal skin changes.  Intact cremasteric reflexes.  No inguinal adenopathy.  No discharge.  Abdominal exam is benign.  Musculoskeletal: He exhibits no edema or tenderness.  Neurological: He is alert.  Skin: Skin is warm and dry.  Psychiatric: He has a normal mood and affect. His behavior is normal. Thought content normal.  Nursing note and vitals reviewed.    ED Treatments / Results  Labs (all labs ordered are  listed, but only abnormal results are displayed) Labs Reviewed  URINALYSIS, ROUTINE W REFLEX MICROSCOPIC  GC/CHLAMYDIA PROBE AMP ([place]) NOT AT [facility]    EKG None  Radiology No results found.   US Scrotum  Result Date: 12/19/2017 CLINICAL DATA:  Left testicular pain for 2 weeks EXAM: SCROTAL ULTRASOUND DOPPLER ULTRASOUND OF THE TESTICLES TECHNIQUE: Complete ultrasound examination of the testicles, epididymis, and other scrotal structures was performed. Color and spectral Doppler ultrasound were also utilized to evaluate blood flow to the testicles. COMPARISON:   None. FINDINGS: Right testicle Measurements: 4.8 x 2.3 x 3.5 cm. No mass. Scattered microcalcifications. Left testicle Measurements: 4.6 x 2.2 x 3.5 cm. No mass. Scattered microcalcifications. Right epididymis:  Small 4 mm cyst in the epididymal head. Left epididymis: Appears heterogeneous and hypovascular relative to the right. Hydrocele:  Small bilateral Varicocele:  None visualized. Pulsed Doppler interrogation of both testes demonstrates normal low resistance arterial and venous waveforms bilaterally. IMPRESSION: Heterogeneous, hypervascular appearance of the left epididymis compatible with epididymitis. Small bilateral hydroceles. Scattered microlithiasis. Current literature suggests that testicular microlithiasis is not a significant independent risk factor for development of testicular carcinoma, and that follow up imaging is not warranted in the absence of other risk factors. Monthly testicular self-examination and annual physical exams are considered appropriate surveillance. If patient has other risk factors for testicular carcinoma, then referral to Urology should be considered. (Reference: DeCastro, et al.: A 5-Year Follow up Study of Asymptomatic Men with Testicular Microlithiasis. J Urol 2008; 179:1420-1423.) Electronically Signed   By: Charlett Nose M.D.   On: 12/19/2017 16:43   US Scrotum Doppler  Result Date: 12/19/2017 CLINICAL DATA:  Left testicular pain for 2 weeks EXAM: SCROTAL ULTRASOUND DOPPLER ULTRASOUND OF THE TESTICLES TECHNIQUE: Complete ultrasound examination of the testicles, epididymis, and other scrotal structures was performed. Color and spectral Doppler ultrasound were also utilized to evaluate blood flow to the testicles. COMPARISON:  None. FINDINGS: Right testicle Measurements: 4.8 x 2.3 x 3.5 cm. No mass. Scattered microcalcifications. Left testicle Measurements: 4.6 x 2.2 x 3.5 cm. No mass. Scattered microcalcifications. Right epididymis:  Small 4 mm cyst in the epididymal  head. Left epididymis: Appears heterogeneous and hypovascular relative to the right. Hydrocele:  Small bilateral Varicocele:  None visualized. Pulsed Doppler interrogation of both testes demonstrates normal low resistance arterial and venous waveforms bilaterally. IMPRESSION: Heterogeneous, hypervascular appearance of the left epididymis compatible with epididymitis. Small bilateral hydroceles. Scattered microlithiasis. Current literature suggests that testicular microlithiasis is not a significant independent risk factor for development of testicular carcinoma, and that follow up imaging is not warranted in the absence of other risk factors. Monthly testicular self-examination and annual physical exams are considered appropriate surveillance. If patient has other risk factors for testicular carcinoma, then referral to Urology should be considered. (Reference: DeCastro, et al.: A 5-Year Follow up Study of Asymptomatic Men with Testicular Microlithiasis. J Urol 2008; 179:1420-1423.) Electronically Signed   By: Charlett Nose M.D.   On: 12/19/2017 16:43    Procedures Procedures (including critical care time)  Medications Ordered in ED Medications - No data to display   Initial Impression / Assessment and Plan / ED Course  I have reviewed the triage vital signs and the nursing notes.  Pertinent labs & imaging results that were available during my care of the patient were reviewed by me and considered in my medical decision making (see chart for details).     37yM with L testicular pain.  Consider L sacroiliac joint dysfunction as  opposed to primary testicular issue. The back pain he has been feeling is centered over the L SI joint. Testicular pain is worse when laying and with walking/movement. He has been evaluated by urology and currently being treated for epididymitis.   Scrotal ultrasound today is indeed consistent with epididymitis.  Otherwise looks okay.  He is currently being treated for the  same.  I doubt emergent process.  No signs of torsion.  No hernia appreciated are noted on imaging.  Plan continued antibiotics and supportive measures.  Return precautions discussed.  Urology follow-up as needed otherwise.  Final Clinical Impressions(s) / ED Diagnoses   Final diagnoses:  Pain in left testicle  Epididymitis    ED Discharge Orders    None       Raeford RazorKohut, Ida Milbrath, MD 12/26/17 [phone]

## 2017-12-19 NOTE — Discharge Instructions (Signed)
Your US does show epididymitis. Continue antibiotics as prescribed by your urologist. Take pain medication as needed. Take 600 mg of ibuprofen every 6 hours as needed in addition to the pain medication. They are safe to take together and you'll get better relief taking them both.

## 2017-12-19 NOTE — ED Triage Notes (Signed)
L testicle pain x 2 weeks, denies swelling. Was seen by urology on Thursday. Pain persists.

## 2017-12-20 LAB — GC/CHLAMYDIA PROBE AMP (~~LOC~~) NOT AT ARMC
Chlamydia: NEGATIVE
Neisseria Gonorrhea: NEGATIVE

## 2018-06-06 ENCOUNTER — Other Ambulatory Visit: Payer: Self-pay

## 2018-06-06 ENCOUNTER — Emergency Department (HOSPITAL_BASED_OUTPATIENT_CLINIC_OR_DEPARTMENT_OTHER): Payer: BLUE CROSS/BLUE SHIELD

## 2018-06-06 ENCOUNTER — Emergency Department (HOSPITAL_BASED_OUTPATIENT_CLINIC_OR_DEPARTMENT_OTHER)
Admission: EM | Admit: 2018-06-06 | Discharge: 2018-06-06 | Disposition: A | Discharge status: Home / Self Care | Payer: BLUE CROSS/BLUE SHIELD | Attending: Emergency Medicine | Admitting: Emergency Medicine

## 2018-06-06 ENCOUNTER — Encounter (HOSPITAL_BASED_OUTPATIENT_CLINIC_OR_DEPARTMENT_OTHER): Payer: Self-pay | Admitting: *Deleted

## 2018-06-06 DIAGNOSIS — Z79899 Other long term (current) drug therapy: Secondary | ICD-10-CM | POA: Insufficient documentation

## 2018-06-06 DIAGNOSIS — Z87891 Personal history of nicotine dependence: Secondary | ICD-10-CM | POA: Insufficient documentation

## 2018-06-06 DIAGNOSIS — I491 Atrial premature depolarization: Secondary | ICD-10-CM | POA: Insufficient documentation

## 2018-06-06 LAB — BASIC METABOLIC PANEL
ANION GAP: 7 (ref 5–15)
BUN: 9 mg/dL (ref 6–20)
CO2: 28 mmol/L (ref 22–32)
Calcium: 9.5 mg/dL (ref 8.9–10.3)
Chloride: 102 mmol/L (ref 98–111)
Creatinine, Ser: 0.91 mg/dL (ref 0.61–1.24)
GFR calc Af Amer: >60 mL/min (ref >60)
GFR calc non Af Amer: >60 mL/min (ref >60)
Glucose, Bld: 92 mg/dL (ref 70–99)
Potassium: 3.4 mmol/L — ABNORMAL LOW (ref 3.5–5.1)
Sodium: 137 mmol/L (ref 135–145)

## 2018-06-06 LAB — TROPONIN I: Troponin I: <0.03 ng/mL (ref <0.03)

## 2018-06-06 LAB — CBC
HCT: 40.6 % (ref 39.0–52.0)
Hemoglobin: 13.3 g/dL (ref 13.0–17.0)
MCH: 30.6 pg (ref 26.0–34.0)
MCHC: 32.8 g/dL (ref 30.0–36.0)
MCV: 93.5 fL (ref 80.0–100.0)
Platelets: 196 10*3/uL (ref 150–400)
RBC: 4.34 MIL/uL (ref 4.22–5.81)
RDW: 11.9 % (ref 11.5–15.5)
WBC: 6.5 10*3/uL (ref 4.0–10.5)
nRBC: 0 % (ref 0.0–0.2)

## 2018-06-06 MED ORDER — METOPROLOL SUCCINATE ER 25 MG PO TB24: 25.0000 mg | ORAL_TABLET | Freq: Every day | ORAL | 0 refills | Status: AC

## 2018-06-06 NOTE — ED Provider Notes (Signed)
[facility] EMERGENCY DEPARTMENT Provider Note   CSN: [id number] Arrival date & time: 06/06/18  2026     History   Chief Complaint Chief Complaint  Patient presents with  . Palpitations    HPI Scott Anthony is a 38 y.o. male.  HPI Patient presents with palpitations.  Had been feeling his heart race.  Started this afternoon.  Has had episodes of this previously.  States he is worn a monitor in the past.  States he was driving his car today and felt it happening more.  No lightheadedness or dizziness.  States he would feel his pulse and it would feel as if there was a pause and then he would feel it going faster.  Does have history anxiety but states he did not feel particularly anxious today.  No weight loss.  No stimulants.  No swelling in his leg.  No trouble breathing. Past Medical History:  Diagnosis Date  . Palpitations   . Tachycardia     There are no active problems to display for this patient.   History reviewed. No pertinent surgical history.      Home Medications    Prior to Admission medications   Medication Sig Start Date End Date Taking? Authorizing Provider  Omega-3 Fatty Acids (FISH OIL PO) Take by mouth.   Yes [provider]  Probiotic Product (PROBIOTIC DAILY PO) Take by mouth.   Yes [provider]  benzonatate (TESSALON) 100 MG capsule Take 1 capsule (100 mg total) by mouth every 8 (eight) hours. 07/09/17   Rolland Porter, MD  ibuprofen (ADVIL,MOTRIN) 800 MG tablet Take 1 tablet (800 mg total) by mouth 3 (three) times daily. 07/09/17   Rolland Porter, MD  metoprolol succinate (TOPROL-XL) 25 MG 24 hr tablet Take 1 tablet (25 mg total) by mouth daily. 06/06/18   Benjiman Core, MD  promethazine-dextromethorphan (PROMETHAZINE-DM) 6.25-15 MG/5ML syrup Take 5 mLs by mouth 4 (four) times daily as needed for cough. 07/09/17   Rolland Porter, MD  traMADol (ULTRAM) 50 MG tablet Take 1 tablet (50 mg total) by mouth every 6 (six) hours as needed.  12/19/17   Raeford Razor, MD    Family History No family history on file.  Social History Social History   Tobacco Use  . Smoking status: Former Games developer  . Smokeless tobacco: Never Used  Substance Use Topics  . Alcohol use: Yes    Comment: occ  . Drug use: No     Allergies   Ivp dye [iodinated diagnostic agents] and Keflex [cephalexin]   Review of Systems Review of Systems  Constitutional: Negative for appetite change, fatigue and fever.  HENT: Negative for congestion.   Respiratory: Negative for shortness of breath.   Cardiovascular: Positive for palpitations. Negative for chest pain.  Gastrointestinal: Negative for abdominal pain.  Genitourinary: Negative for flank pain.  Musculoskeletal: Negative for back pain.  Skin: Negative for rash.  Neurological: Negative for weakness.  Psychiatric/Behavioral: Negative for confusion.     Physical Exam Updated Vital Signs BP 123/84 (BP Location: Left Arm)   Pulse 78   Temp 98.2 F (36.8 C) (Oral)   Resp 16   Ht 5\' 10"  (1.778 m)   Wt 72.6 kg   SpO2 100%   BMI 22.96 kg/m   Physical Exam HENT:     Head: Atraumatic.     Mouth/Throat:     Mouth: Mucous membranes are moist.  Neck:     Musculoskeletal: Neck supple.  Cardiovascular:  Rate and Rhythm: Normal rate. Rhythm irregular.  Pulmonary:     Effort: Pulmonary effort is normal.  Abdominal:     Tenderness: There is no abdominal tenderness.  Musculoskeletal:     Right lower leg: No edema.     Left lower leg: No edema.  Skin:    General: Skin is warm.     Capillary Refill: Capillary refill takes less than 2 seconds.  Neurological:     Mental Status: He is alert and oriented to person, place, and time.  Psychiatric:        Mood and Affect: Mood normal.      ED Treatments / Results  Labs (all labs ordered are listed, but only abnormal results are displayed) Labs Reviewed  BASIC METABOLIC PANEL - Abnormal; Notable for the following components:       Result Value   Potassium 3.4 (*)    All other components within normal limits  CBC  TROPONIN I    EKG EKG Interpretation  Date/Time:  Monday June 06 2018 20:32:28 EST Ventricular Rate:  84 PR Interval:  158 QRS Duration: 90 QT Interval:  350 QTC Calculation: 413 R Axis:   34 Text Interpretation:  Sinus rhythm with Premature atrial complexes Otherwise normal ECG Confirmed by Benjiman CorePickering, Hart Haas [phone]([id number]) on 06/06/2018 10:22:22 PM   Radiology Dg Chest 2 View  Result Date: 06/06/2018 CLINICAL DATA:  Palpitations and left-sided chest pain. EXAM: CHEST - 2 VIEW COMPARISON:  01/06/2017 FINDINGS: The heart size and mediastinal contours are within normal limits. Both lungs are clear. The visualized skeletal structures are unremarkable. IMPRESSION: No active cardiopulmonary disease. Electronically Signed   By: Burman NievesWilliam  Stevens M.D.   On: 06/06/2018 21:52    Procedures Procedures (including critical care time)  Medications Ordered in ED Medications - No data to display   Initial Impression / Assessment and Plan / ED Course  I have reviewed the triage vital signs and the nursing notes.  Pertinent labs & imaging results that were available during my care of the patient were reviewed by me and considered in my medical decision making (see chart for details).     Patient with palpitation feeling.  Some anxiety to which could be a component of this.  Has PACs on EKG.  Labs reassuring.  Minimal hypokalemia.  Will start on low-dose metoprolol.  States this is been suggested in the past.  Has follow-up with her his PCP tomorrow.  Will discharge home.  Final Clinical Impressions(s) / ED Diagnoses   Final diagnoses:  Premature atrial contractions    ED Discharge Orders         Ordered    metoprolol succinate (TOPROL-XL) 25 MG 24 hr tablet  Daily     06/06/18 2243           Benjiman CorePickering, Detria Cummings, MD 06/06/18 2302

## 2018-06-06 NOTE — ED Triage Notes (Signed)
Heart palpitations x 15 minutes. Indigestion.

## 2019-12-27 IMAGING — US US SCROTUM W/ DOPPLER COMPLETE
1 series · 14 of 25 positions shown · non-contrast
Comparison: Scrotal ultrasound dated December 19, 2014.

CLINICAL DATA: Right-sided testicular swelling and pain.

EXAM:
SCROTAL ULTRASOUND
DOPPLER ULTRASOUND OF THE TESTICLES
TECHNIQUE: Complete ultrasound examination of the testicles, epididymis, and
other scrotal structures was performed. Color and spectral Doppler
ultrasound were also utilized to evaluate blood flow to the
testicles.

[Series 1: us scrotum w/ doppler complete · 0.06mm/px · 14 of 51 slices shown]
[im 1/51]
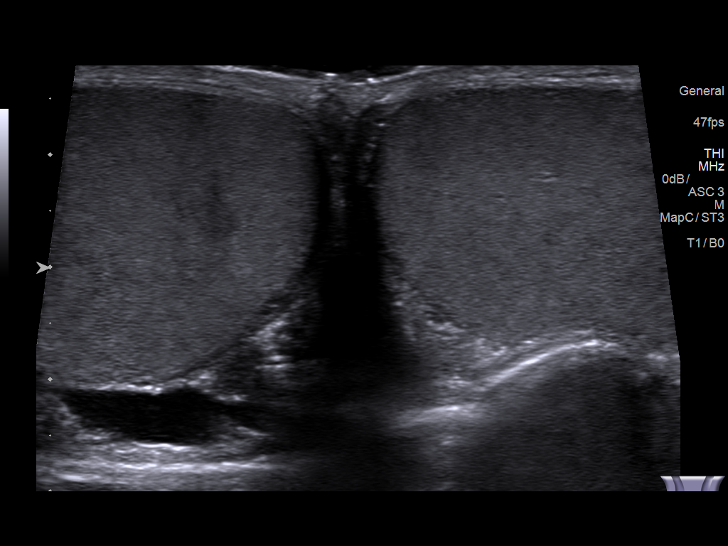
[im 5/51]
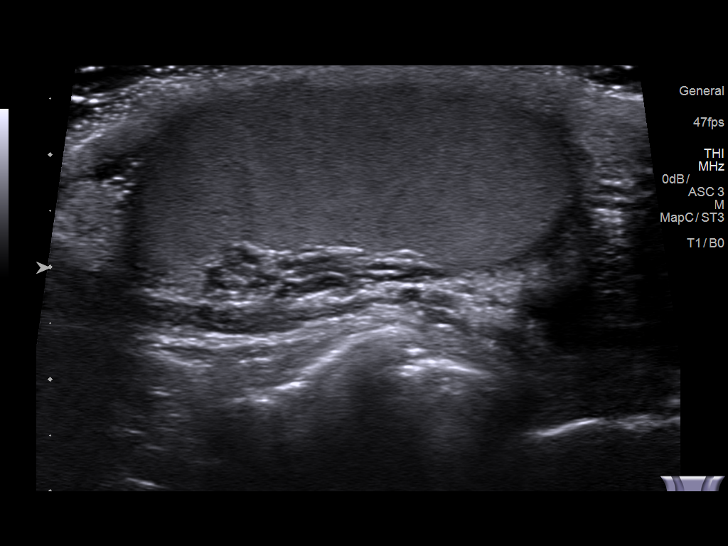
[im 9/51]
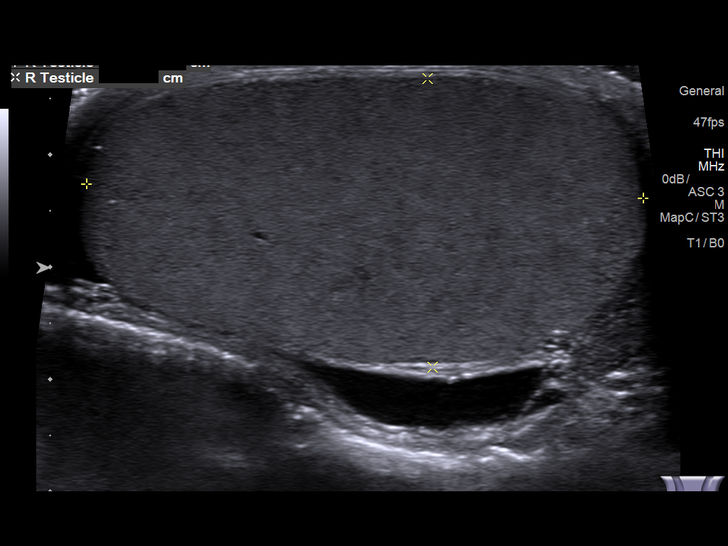
[im 13/51]
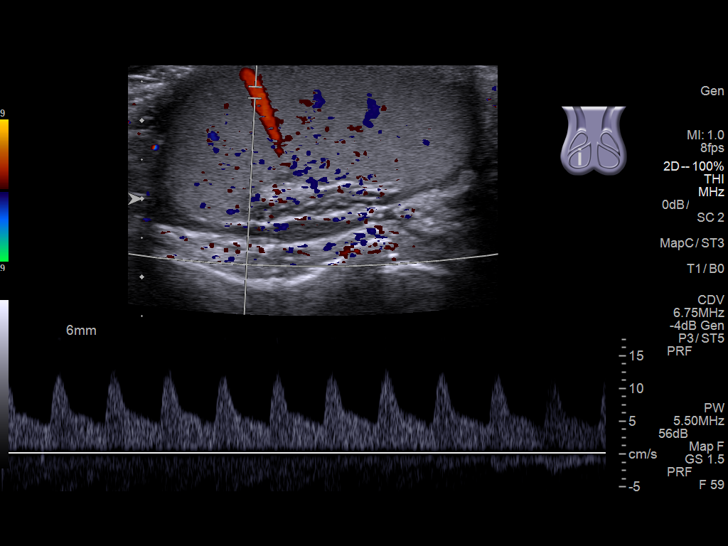
[im 17/51]
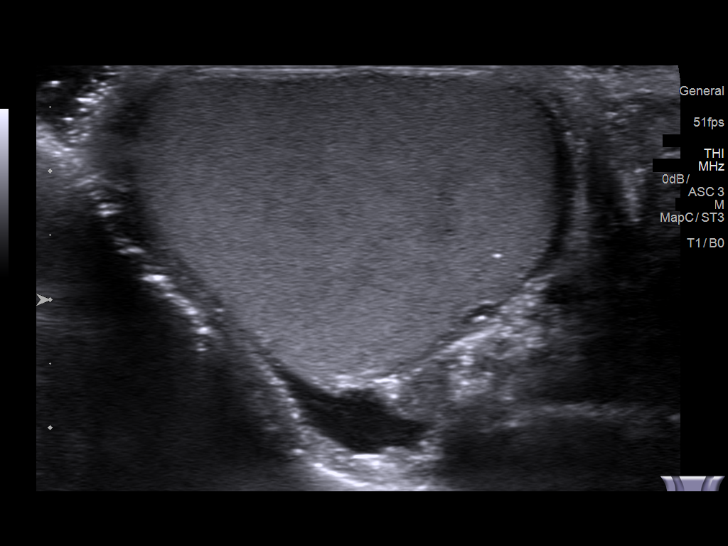
[im 19/51]
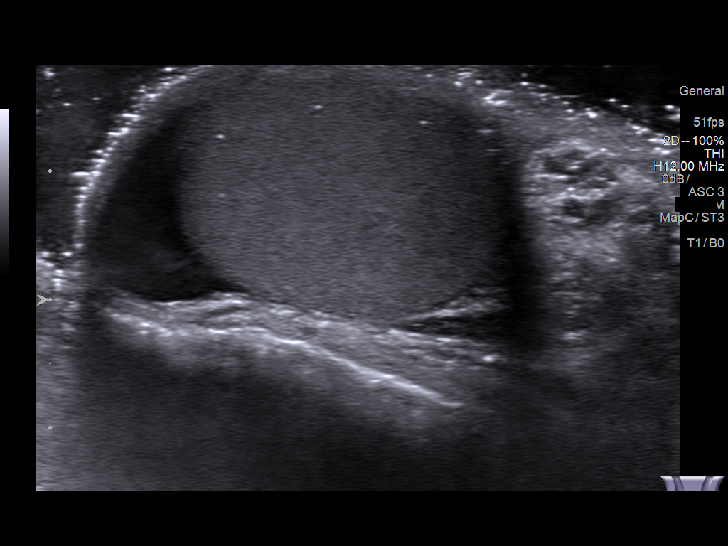
[im 23/51]
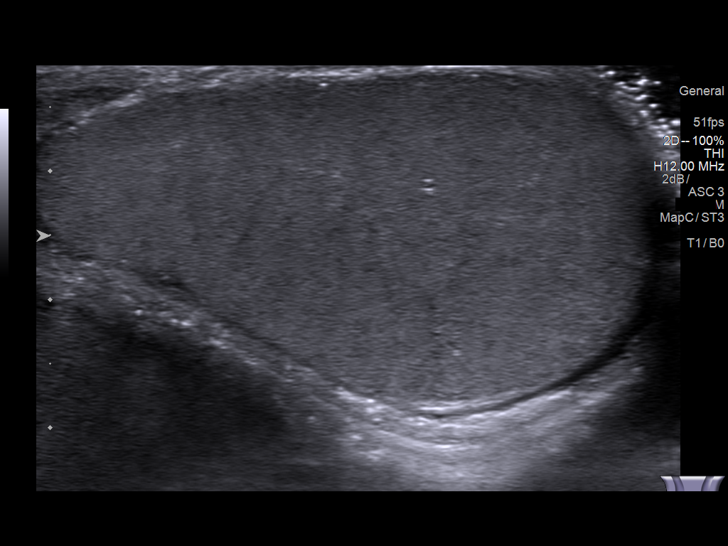
[im 28/51]
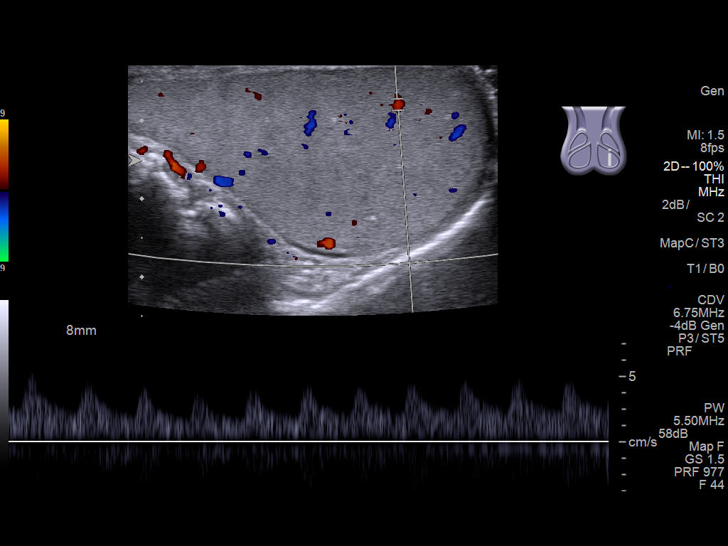
[im 32/51]
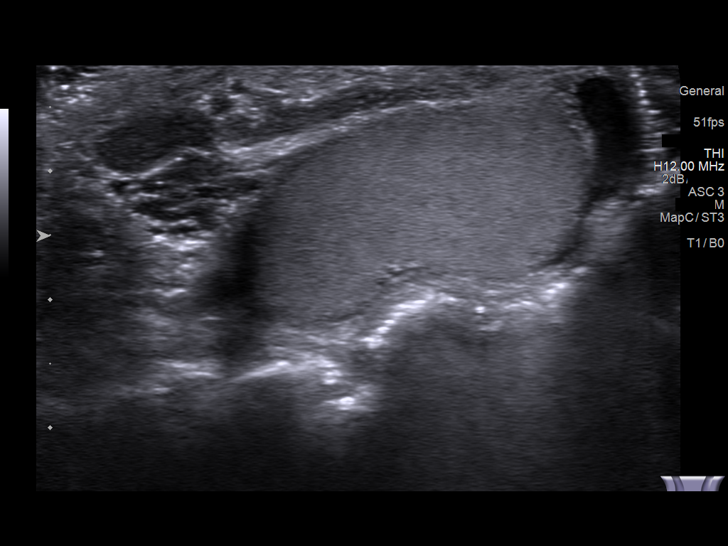
[im 34/51]
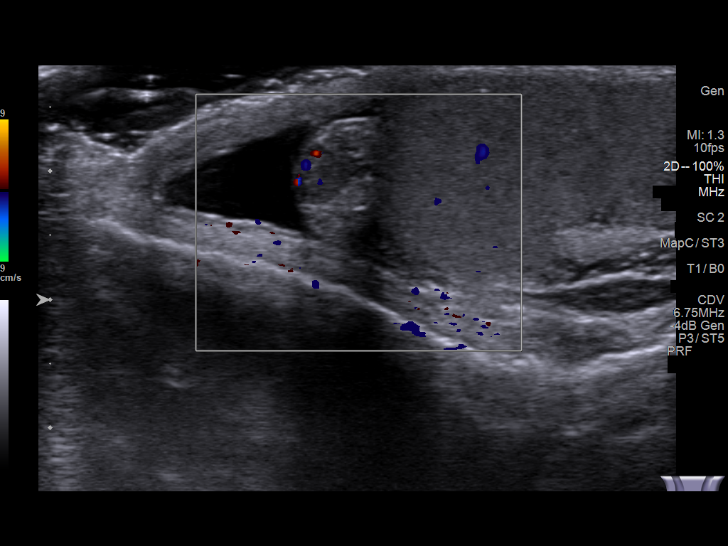
[im 38/51]
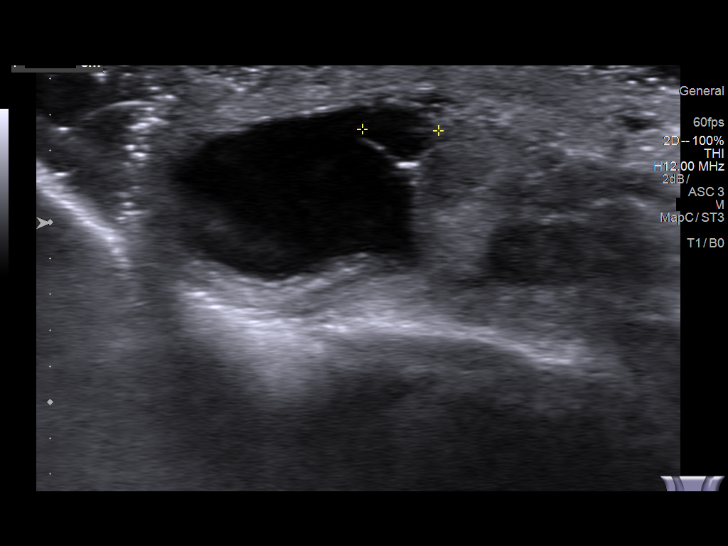
[im 42/51]
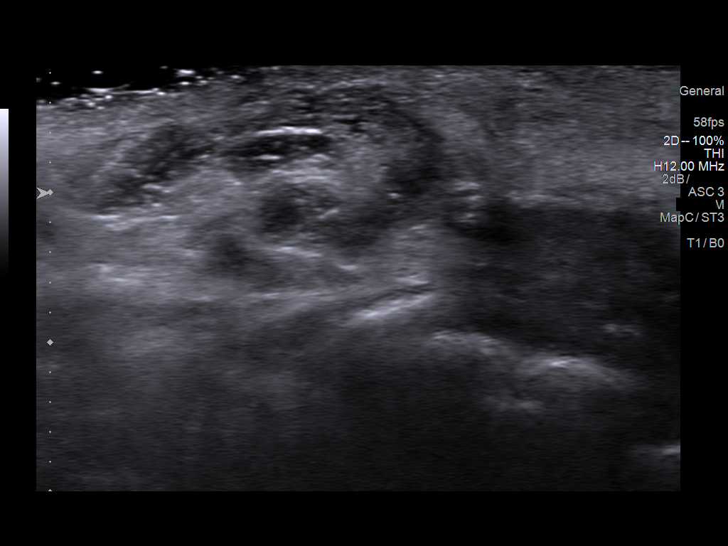
[im 46/51]
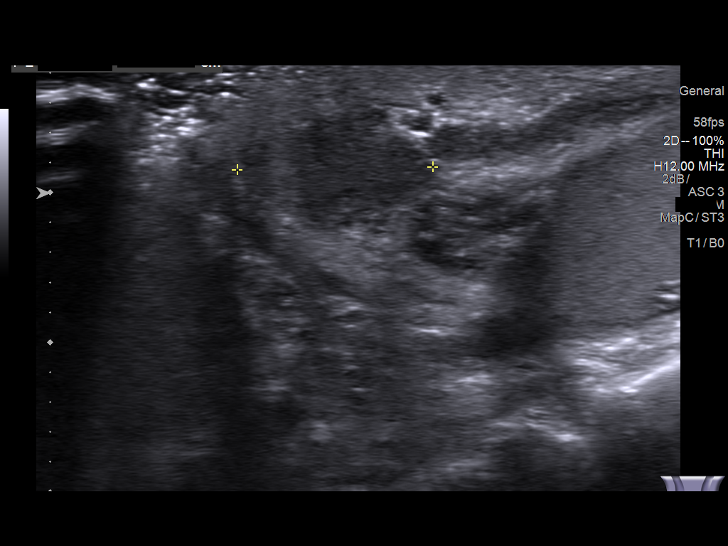
[im 51/51]
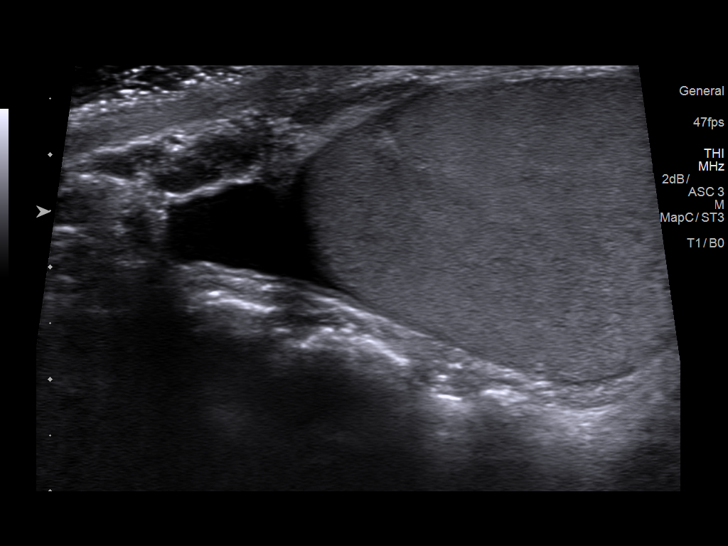

[14 of 25 positions shown; findings below may reference images not displayed]

FINDINGS: Right testicle

Measurements: 5.0 X 2.6 x 3.5 cm. No mass or microlithiasis
visualized.

Left testicle

Measurements: 4.8 x 2.5 x 3.6 cm. No mass or microlithiasis
visualized.

Right epididymis: Normal in size and appearance. Small 4 mm cyst in
the epididymal head.

Left epididymis:  Normal in size and appearance.

Hydrocele:  Small bilateral hydroceles.

Varicocele:  None visualized.

Pulsed Doppler interrogation of both testes demonstrates normal low
resistance arterial and venous waveforms bilaterally.
IMPRESSION: 1. No acute abnormality.
2. Small bilateral hydroceles.

## 2020-06-07 IMAGING — US US SCROTUM W/ DOPPLER COMPLETE
1 series · 13 of 25 positions shown · non-contrast
Comparison: None.

CLINICAL DATA: Left testicular pain for 2 weeks

EXAM:
SCROTAL ULTRASOUND
DOPPLER ULTRASOUND OF THE TESTICLES
TECHNIQUE: Complete ultrasound examination of the testicles, epididymis, and
other scrotal structures was performed. Color and spectral Doppler
ultrasound were also utilized to evaluate blood flow to the
testicles.

[Series 1: us scrotum w/ doppler complete · 0.07mm/px · 13 of 50 slices shown]
[im 1/50]
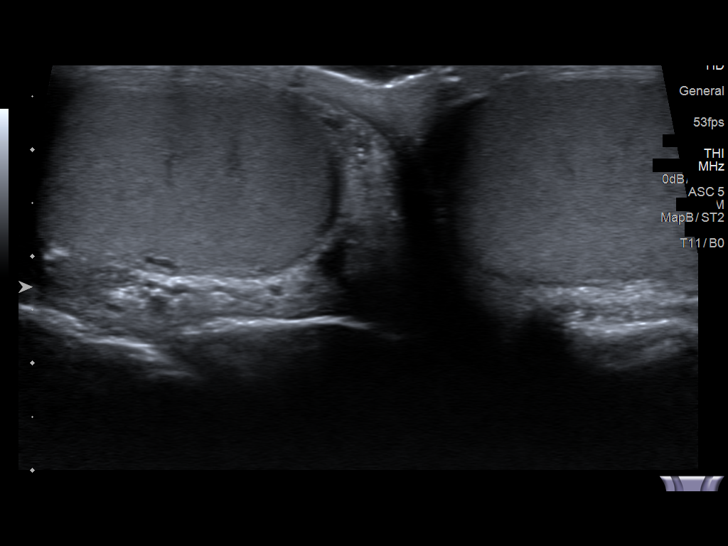
[im 5/50]
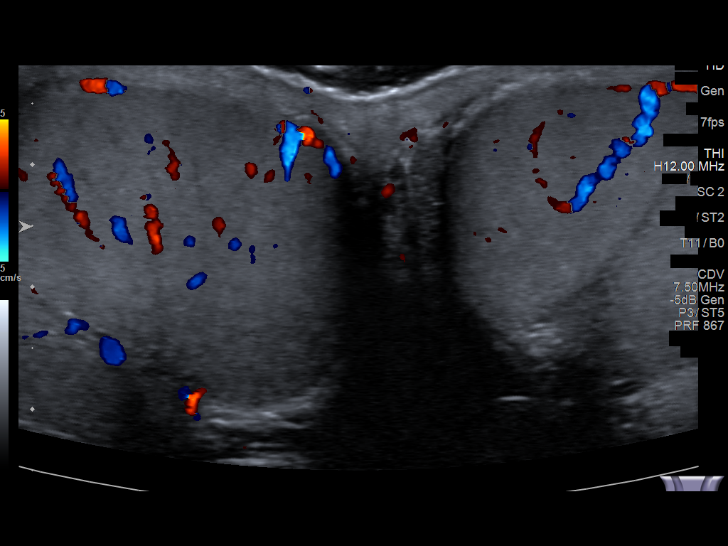
[im 9/50]
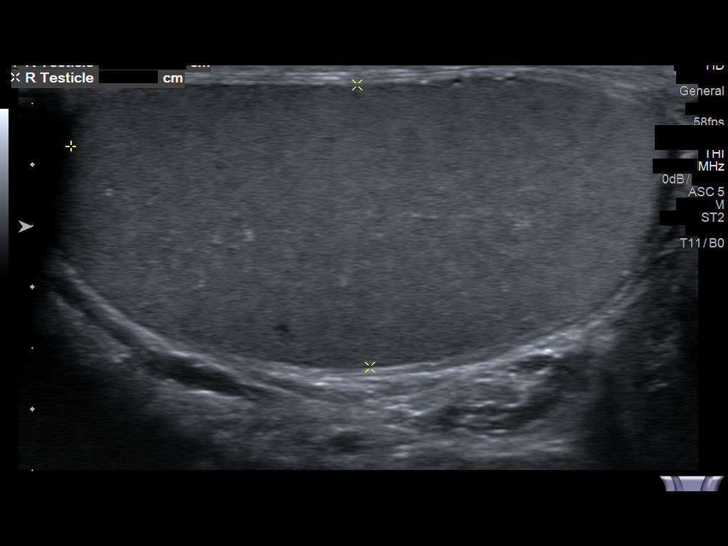
[im 13/50]
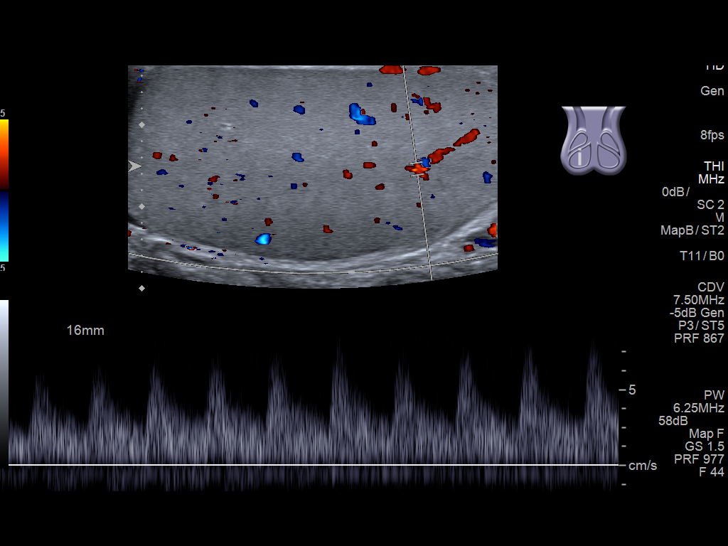
[im 17/50]
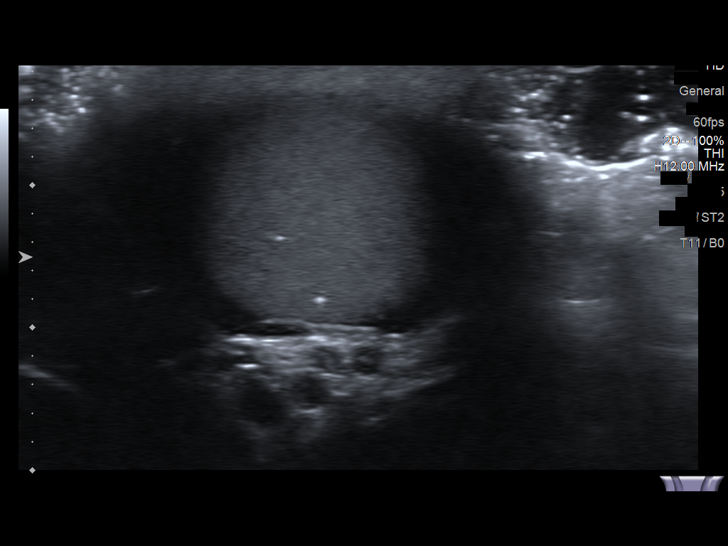
[im 21/50]
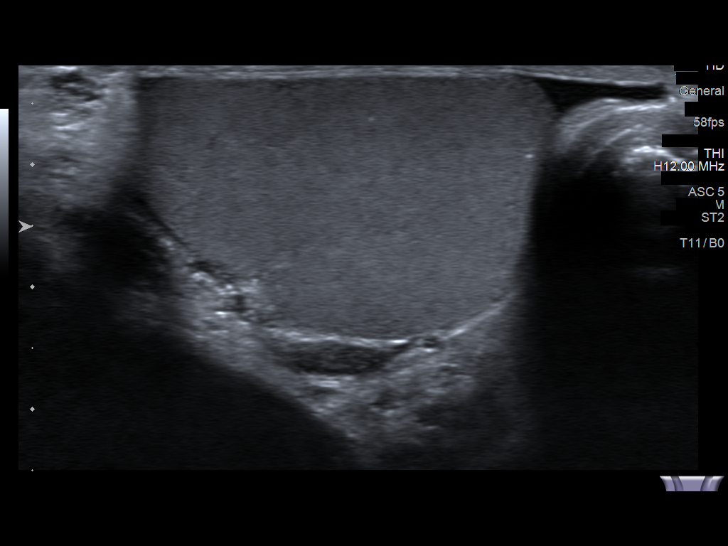
[im 25/50]
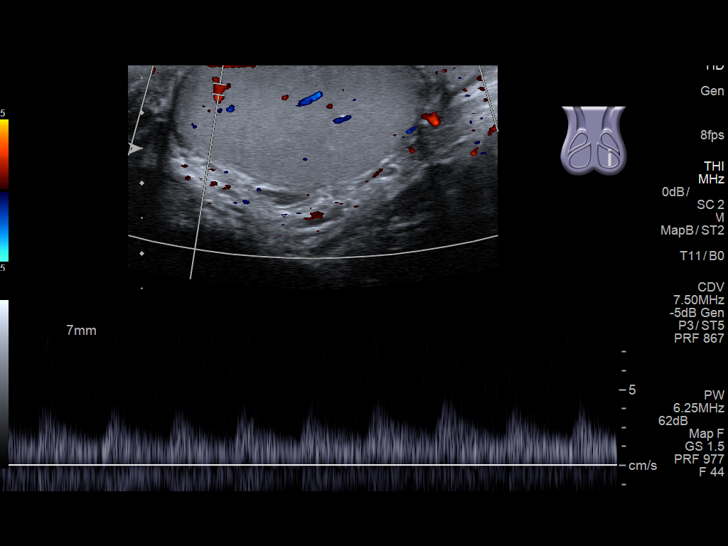
[im 29/50]
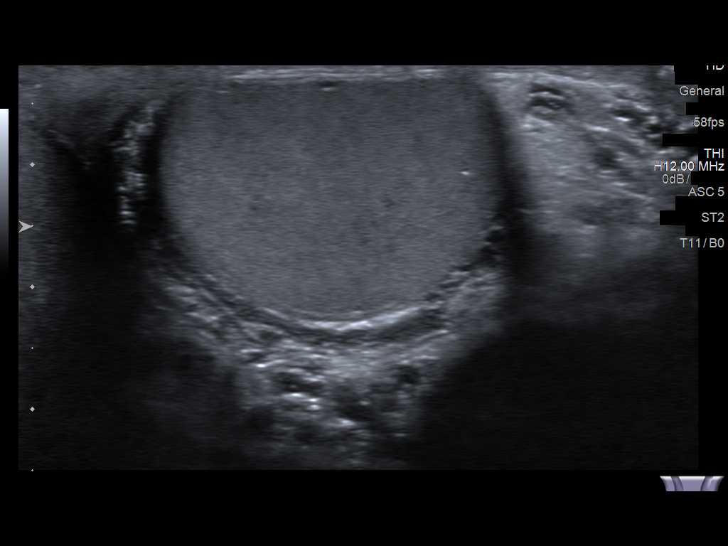
[im 33/50]
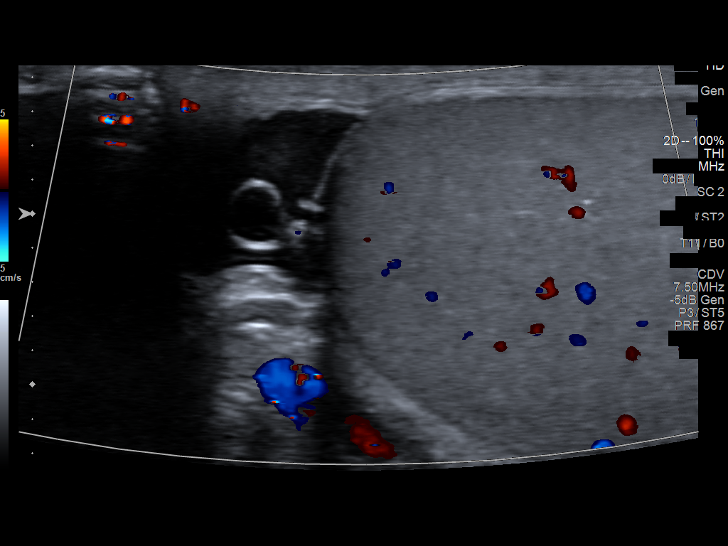
[im 37/50]
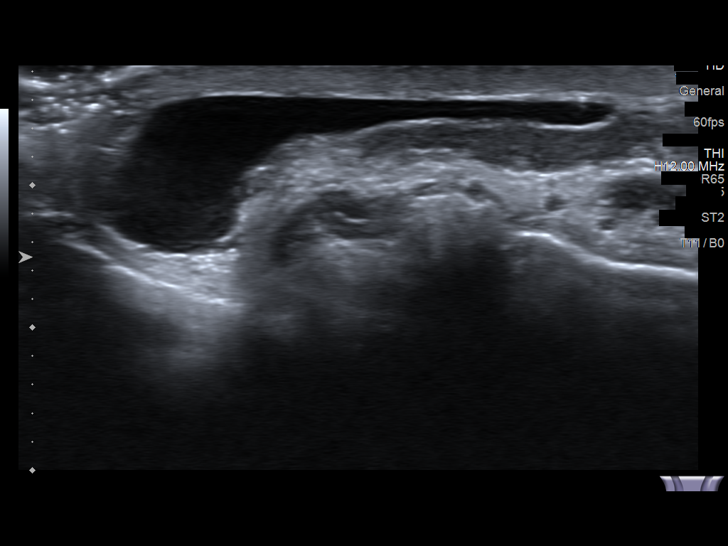
[im 41/50]
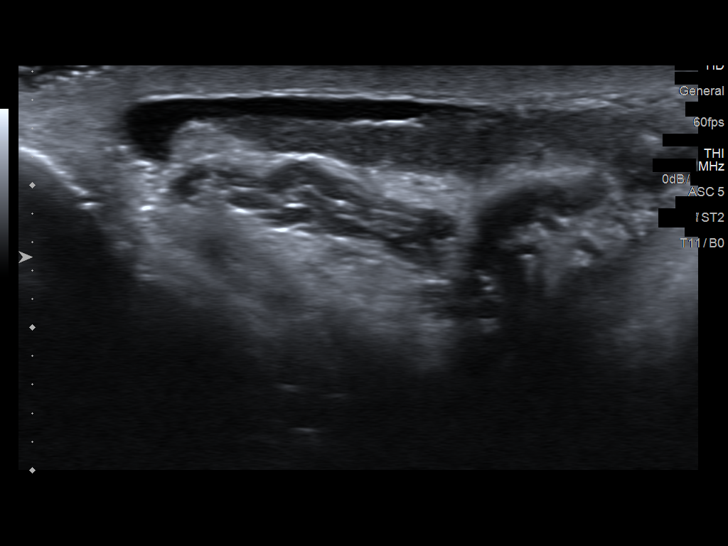
[im 45/50]
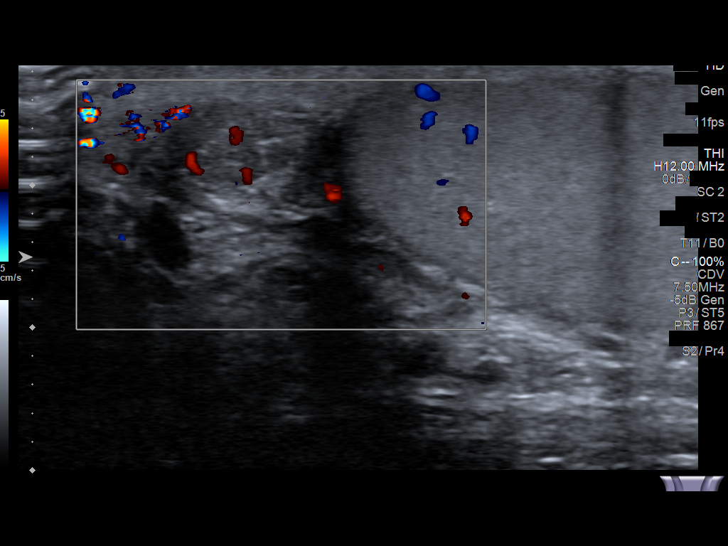
[im 50/50]
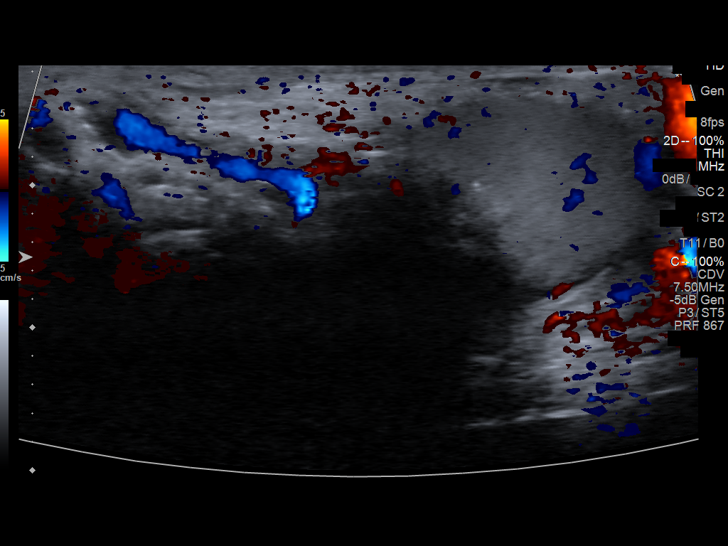

[13 of 25 positions shown; findings below may reference images not displayed]

FINDINGS: Right testicle

Measurements: 4.8 x 2.3 x 3.5 cm. No mass. Scattered
microcalcifications.

Left testicle

Measurements: 4.6 x 2.2 x 3.5 cm. No mass. Scattered
microcalcifications.

Right epididymis:  Small 4 mm cyst in the epididymal head.

Left epididymis: Appears heterogeneous and hypovascular relative to
the right.

Hydrocele:  Small bilateral

Varicocele:  None visualized.

Pulsed Doppler interrogation of both testes demonstrates normal low
resistance arterial and venous waveforms bilaterally.
IMPRESSION: Heterogeneous, hypervascular appearance of the left epididymis
compatible with epididymitis.

Small bilateral hydroceles.

Scattered microlithiasis. Current literature suggests that
testicular microlithiasis is not a significant independent risk
factor for development of testicular carcinoma, and that follow up
imaging is not warranted in the absence of other risk factors.
Monthly testicular self-examination and annual physical exams are
considered appropriate surveillance. If patient has other risk
factors for testicular carcinoma, then referral to Urology should be
considered. (Reference: Maca, et al.: A 5-Year Follow up Study
of Asymptomatic Men with Testicular Microlithiasis. J Urol 4005;

## 2020-11-03 IMAGING — CR DG CHEST 2V
2 series · 2 of 2 positions shown · non-contrast
Comparison: 01/06/2017

CLINICAL DATA: Palpitations and left-sided chest pain.

EXAM:
CHEST - 2 VIEW

[w chest pa]
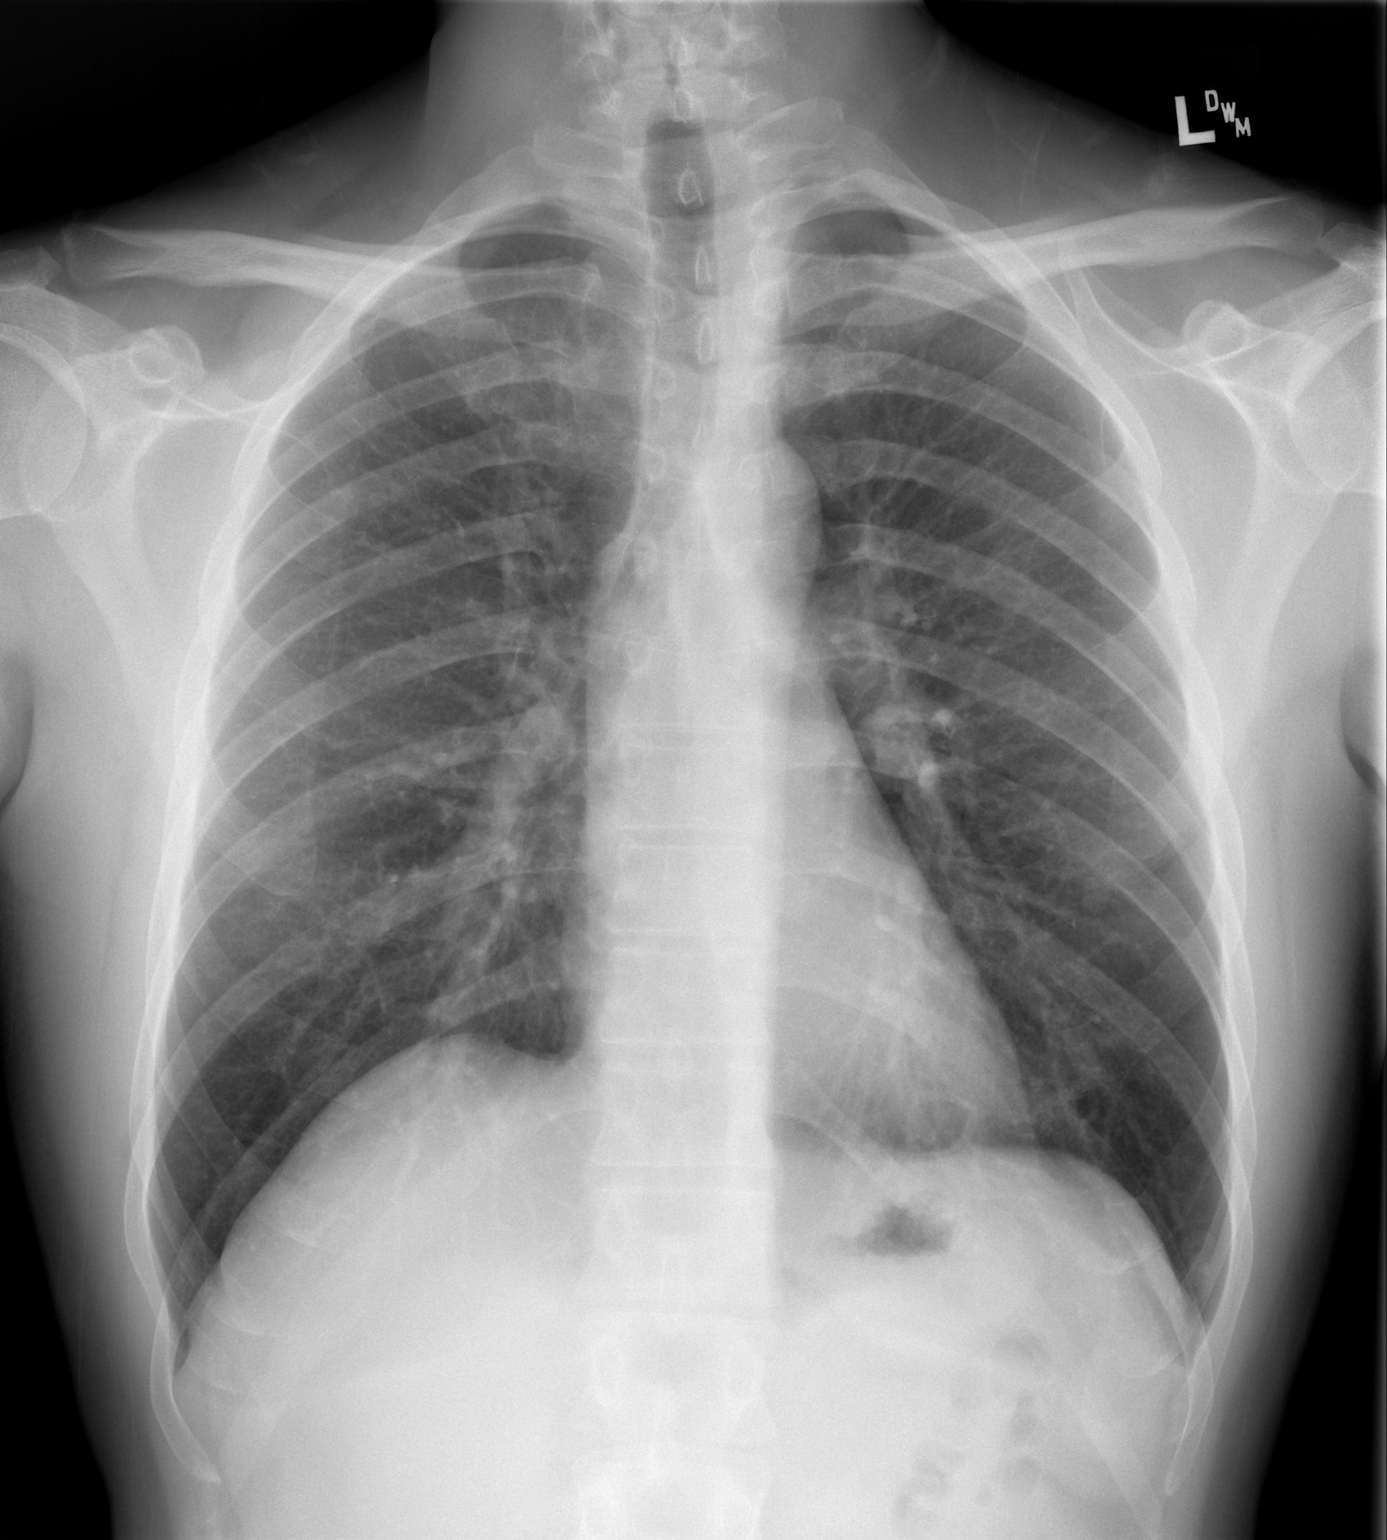

[w chest lat]
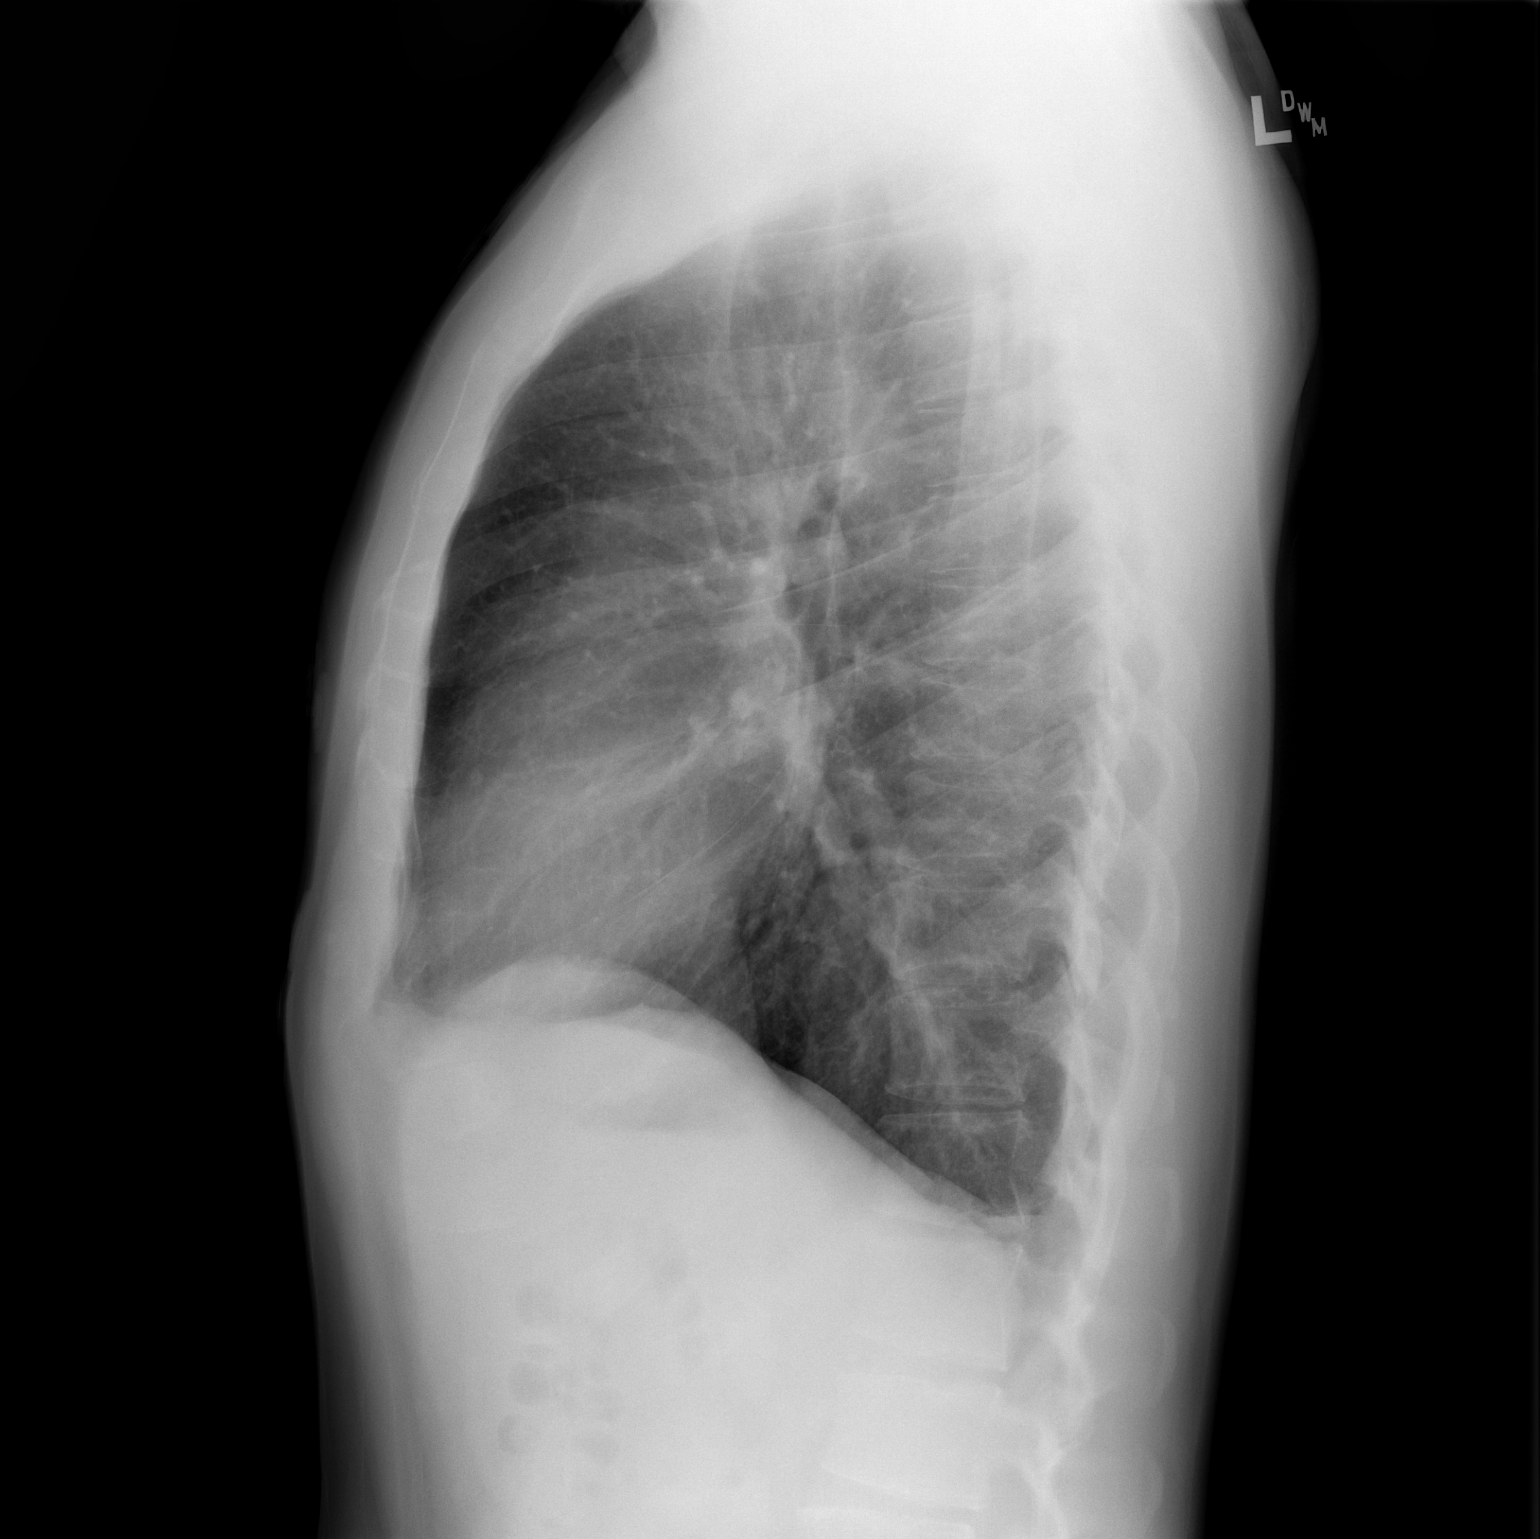

[2 of 2 positions shown; findings below may reference images not displayed]

FINDINGS: The heart size and mediastinal contours are within normal limits.
Both lungs are clear. The visualized skeletal structures are
unremarkable.
IMPRESSION: No active cardiopulmonary disease.
